# Patient Record
Sex: Female | Born: 1965 | Race: Black or African American | Hispanic: No | Marital: Single | Smoking: Former smoker
Health system: Southern US, Community
[De-identification: ages and names within clinical notes are randomized; demographics above are authoritative.]

## PROBLEM LIST (undated history)

## (undated) DIAGNOSIS — I10 Essential (primary) hypertension: Secondary | ICD-10-CM

## (undated) HISTORY — PX: ACHILLES TENDON REPAIR: SUR1153

## (undated) HISTORY — PX: CHOLECYSTECTOMY: SHX55

## (undated) HISTORY — PX: ANKLE SURGERY: SHX546

## (undated) HISTORY — PX: REPLACEMENT TOTAL KNEE BILATERAL: SUR1225

## (undated) HISTORY — PX: TUBAL LIGATION: SHX77

## (undated) HISTORY — PX: TONSILLECTOMY: SUR1361

---

## 2010-09-17 ENCOUNTER — Emergency Department (HOSPITAL_COMMUNITY)
Admission: EM | Admit: 2010-09-17 | Discharge: 2010-09-17 | Disposition: A | Discharge status: Home / Self Care | Payer: Medicaid Other | Attending: Emergency Medicine | Admitting: Emergency Medicine

## 2010-09-17 DIAGNOSIS — B86 Scabies: Secondary | ICD-10-CM | POA: Insufficient documentation

## 2010-09-17 DIAGNOSIS — R059 Cough, unspecified: Secondary | ICD-10-CM | POA: Insufficient documentation

## 2010-09-17 DIAGNOSIS — J069 Acute upper respiratory infection, unspecified: Secondary | ICD-10-CM | POA: Insufficient documentation

## 2010-09-17 DIAGNOSIS — R05 Cough: Secondary | ICD-10-CM | POA: Insufficient documentation

## 2010-09-22 ENCOUNTER — Emergency Department (HOSPITAL_COMMUNITY): Payer: Medicaid Other

## 2010-09-22 ENCOUNTER — Emergency Department (HOSPITAL_COMMUNITY)
Admission: EM | Admit: 2010-09-22 | Discharge: 2010-09-22 | Disposition: A | Discharge status: Home / Self Care | Payer: Medicaid Other | Attending: Emergency Medicine | Admitting: Emergency Medicine

## 2010-09-22 DIAGNOSIS — J069 Acute upper respiratory infection, unspecified: Secondary | ICD-10-CM | POA: Insufficient documentation

## 2010-09-22 DIAGNOSIS — R0602 Shortness of breath: Secondary | ICD-10-CM | POA: Insufficient documentation

## 2010-09-22 DIAGNOSIS — I10 Essential (primary) hypertension: Secondary | ICD-10-CM | POA: Insufficient documentation

## 2010-10-01 ENCOUNTER — Emergency Department (HOSPITAL_COMMUNITY): Payer: Medicaid Other

## 2010-10-01 ENCOUNTER — Emergency Department (HOSPITAL_COMMUNITY)
Admission: EM | Admit: 2010-10-01 | Discharge: 2010-10-01 | Disposition: A | Discharge status: Home / Self Care | Payer: Medicaid Other | Attending: Emergency Medicine | Admitting: Emergency Medicine

## 2010-10-01 DIAGNOSIS — I10 Essential (primary) hypertension: Secondary | ICD-10-CM | POA: Insufficient documentation

## 2010-10-01 DIAGNOSIS — J45909 Unspecified asthma, uncomplicated: Secondary | ICD-10-CM | POA: Insufficient documentation

## 2010-10-01 DIAGNOSIS — R079 Chest pain, unspecified: Secondary | ICD-10-CM | POA: Insufficient documentation

## 2010-10-01 DIAGNOSIS — J40 Bronchitis, not specified as acute or chronic: Secondary | ICD-10-CM | POA: Insufficient documentation

## 2010-10-01 DIAGNOSIS — R0602 Shortness of breath: Secondary | ICD-10-CM | POA: Insufficient documentation

## 2010-10-12 ENCOUNTER — Emergency Department (HOSPITAL_COMMUNITY): Payer: Medicaid Other

## 2010-10-12 ENCOUNTER — Inpatient Hospital Stay (HOSPITAL_COMMUNITY)
Admission: EM | Admit: 2010-10-12 | Discharge: 2010-10-13 | DRG: 203 | Disposition: A | Discharge status: Home / Self Care | Payer: Medicaid Other | Attending: Internal Medicine | Admitting: Internal Medicine

## 2010-10-12 DIAGNOSIS — E876 Hypokalemia: Secondary | ICD-10-CM | POA: Diagnosis present

## 2010-10-12 DIAGNOSIS — K5900 Constipation, unspecified: Secondary | ICD-10-CM | POA: Diagnosis present

## 2010-10-12 DIAGNOSIS — R071 Chest pain on breathing: Secondary | ICD-10-CM | POA: Diagnosis present

## 2010-10-12 DIAGNOSIS — J45901 Unspecified asthma with (acute) exacerbation: Principal | ICD-10-CM | POA: Diagnosis present

## 2010-10-12 DIAGNOSIS — I498 Other specified cardiac arrhythmias: Secondary | ICD-10-CM | POA: Diagnosis not present

## 2010-10-12 DIAGNOSIS — I10 Essential (primary) hypertension: Secondary | ICD-10-CM | POA: Diagnosis present

## 2010-10-12 DIAGNOSIS — T496X5A Adverse effect of otorhinolaryngological drugs and preparations, initial encounter: Secondary | ICD-10-CM | POA: Diagnosis not present

## 2010-10-12 LAB — DIFFERENTIAL
Basophils Absolute: 0.1 10*3/uL (ref 0.0–0.1)
Basophils Relative: 1 % (ref 0–1)
Eosinophils Absolute: 1.1 10*3/uL — ABNORMAL HIGH (ref 0.0–0.7)
Eosinophils Relative: 8 % — ABNORMAL HIGH (ref 0–5)
Monocytes Absolute: 0.3 10*3/uL (ref 0.1–1.0)
Monocytes Relative: 2 % — ABNORMAL LOW (ref 3–12)
Neutro Abs: 11 10*3/uL — ABNORMAL HIGH (ref 1.7–7.7)

## 2010-10-12 LAB — MRSA PCR SCREENING: MRSA by PCR: NEGATIVE

## 2010-10-12 LAB — CBC
MCH: 27.3 pg (ref 26.0–34.0)
MCHC: 33 g/dL (ref 30.0–36.0)
Platelets: 338 10*3/uL (ref 150–400)
RDW: 16.2 % — ABNORMAL HIGH (ref 11.5–15.5)

## 2010-10-12 LAB — POCT I-STAT, CHEM 8
Chloride: 104 mEq/L (ref 96–112)
Glucose, Bld: 127 mg/dL — ABNORMAL HIGH (ref 70–99)
HCT: 38 % (ref 36.0–46.0)
Potassium: 2.9 mEq/L — ABNORMAL LOW (ref 3.5–5.1)
Sodium: 141 mEq/L (ref 135–145)

## 2010-10-12 LAB — PRO B NATRIURETIC PEPTIDE: Pro B Natriuretic peptide (BNP): 22.3 pg/mL (ref 0–125)

## 2010-10-12 LAB — CK TOTAL AND CKMB (NOT AT ARMC)
CK, MB: 1.3 ng/mL (ref 0.3–4.0)
Relative Index: INVALID (ref 0.0–2.5)
Total CK: 88 U/L (ref 7–177)

## 2010-10-12 LAB — D-DIMER, QUANTITATIVE: D-Dimer, Quant: 0.29 ug/mL-FEU (ref 0.00–0.48)

## 2010-10-13 LAB — DIFFERENTIAL
Basophils Absolute: 0 10*3/uL (ref 0.0–0.1)
Eosinophils Absolute: 0 10*3/uL (ref 0.0–0.7)
Eosinophils Relative: 0 % (ref 0–5)
Monocytes Absolute: 0.1 10*3/uL (ref 0.1–1.0)

## 2010-10-13 LAB — CBC
HCT: 37.1 % (ref 36.0–46.0)
MCHC: 32.1 g/dL (ref 30.0–36.0)
MCV: 82.3 fL (ref 78.0–100.0)
Platelets: 373 10*3/uL (ref 150–400)
RDW: 16.1 % — ABNORMAL HIGH (ref 11.5–15.5)
WBC: 11 10*3/uL — ABNORMAL HIGH (ref 4.0–10.5)

## 2010-10-13 LAB — BASIC METABOLIC PANEL
BUN: 6 mg/dL (ref 6–23)
Calcium: 9.6 mg/dL (ref 8.4–10.5)
GFR calc non Af Amer: >60 mL/min (ref >60)
Glucose, Bld: 189 mg/dL — ABNORMAL HIGH (ref 70–99)
Potassium: 4.2 mEq/L (ref 3.5–5.1)
Sodium: 141 mEq/L (ref 135–145)

## 2010-10-13 LAB — MAGNESIUM: Magnesium: 2.1 mg/dL (ref 1.5–2.5)

## 2010-10-16 NOTE — Discharge Summary (Signed)
Angela Hoffman, [name]             ACCOUNT NO.:  [id number]  MEDICAL RECORD NO.:  [id number]           PATIENT TYPE:  I  LOCATION:  1423                         FACILITY:  [facility]  PHYSICIAN:  Hartley Barefoot, MD    DATE OF BIRTH:  08-Jun-1965  DATE OF ADMISSION:  10/12/2010 DATE OF DISCHARGE:  10/13/2010                              DISCHARGE SUMMARY   DISCHARGE DIAGNOSES: 1. Acute asthma exacerbation. 2. Hypokalemia. 3. Hypertension. 4. Pleuritic chest pain.  DISCHARGE MEDICATIONS: 1. Guaifenesin 110 mg 5 mL by mouth every 4 hours as needed for cough. 2. Levaquin 750 mg p.o. daily. 3. Prednisone taper dose 20 mg 3 tablets for 3 days, 2 tablets for 2     days, then 1 tablet for 2 days, then half tablet, then stop. 4. Albuterol inhaler 90 mcg inhaled every 4 to 6 hours as needed. 5. Polyethylene glycol 17 g p.o. daily as needed. 6. Vicodin 5/500 one tablet every 6 hours as needed. 7. Lasix 40 mg p.o. daily. 8. Lisinopril/hydrochlorothiazide 20/25 p.o. day. 9. Prilosec 20 mg p.o. daily. 10.Xanax 2 mg 1 tablet by mouth daily.  BRIEF HISTORY OF PRESENT ILLNESS:  This is a 45 year old with past medical history of asthma and has had frequent ED visit over the last month, presents with for worsening shortness of breath, cough over the last 3 days.  For further details, please refer to HPI, performed on May 27.  RADIOGRAPHIC STUDIES:  Chest x-ray showed no evidence of acute cardiopulmonary disease.  HOSPITAL COURSE: 1. Asthma exacerbation.  The patient was admitted.  She was started on     IV steroids, Solu-Medrol 60 mg q.6h.  She was also started on     nebulizer treatment every 2 hours and it was then transitioned to     every 4 hours over course of hospitalization.  The patient was also     started on Levaquin and she will finish a total of 5 days.  Next     day of hospitalization, the patient was feeling better.  No     significant wheezing, on lung exam.  She was feeling at  baseline.  She     wants to go home.  She will be discharged on a prednisone     taper dose and antibiotics.  She was advised to follow with her     primary care physician and if any of her symptoms change, to return     to the emergency department. 2. Hypokalemia.  This was repleted.  Her potassium on day of discharge     at 4.2. 3. Hypertension.  The patient will be discharged on     hydrochlorothiazide and lisinopril.  She is also on Lasix for fluid     retention as needed.  I will refer to her primary care physician     but maybe she does not need to be on both hydrochlorothiazide and     Lasix.  On day of discharge, the patient was in a stable condition.  Pulse 96, respirations 16, saturation 96, blood pressure 153/83, temperature 98. Labs, sodium 141, potassium 4.2, chloride 105,  bicarb 25, glucose 189, BUN 6, creatinine 0.6, mag 2.2, white blood cell 11, hemoglobin 11.9, platelets 373,000.  The patient was discharged in improved condition.     Hartley Barefoot, MD     BR/MEDQ  D:  10/13/2010  T:  10/13/2010  Job:  [id number]  Electronically Signed by Hartley Barefoot MD on 10/16/2010 10:04:58 PM

## 2010-10-23 NOTE — H&P (Signed)
Angela Hoffman, Angela Hoffman             ACCOUNT NO.:  [id number]  MEDICAL RECORD NO.:  [id number]           PATIENT TYPE:  I  LOCATION:  1224                         FACILITY:  [facility]  PHYSICIAN:  Tiamarie Furnari L. Ladona Ridgel, MD  DATE OF BIRTH:  Aug 18, 1965  DATE OF ADMISSION:  10/12/2010 DATE OF DISCHARGE:                             HISTORY & PHYSICAL   PRIMARY CARE PHYSICIAN:  The patient does not have a local primary care physician and does not recall her home primary care doctor.  CHIEF COMPLAINT:  Shortness of breath x 3 days.  HISTORY OF PRESENT ILLNESS:  The patient is a 45 year old African American female with a history of asthma.  She states she was seen 2 weeks' ago here and had steroids for similar symptoms of shortness of breath, cough and chest discomfort.  The patient states that she finished her steroids last week and the symptoms started to return.  She describes her symptoms as a tight cough, shortness of breath, yellowsputum, and chest discomfort only when she coughs.  It is located in her central chest and can range from a 5 to a 10 depending on how hard she is coughing.  The patient states that she uses a machine at home which she identifies as a nebulizer, however, what I see at the bedside and documented in previous notes in the ER is that she has metered-dose inhalers.  The patient states her symptoms did not respond to the treatment at home and she does not recall getting antibiotics at the last visit.  However, review of the medical records shows that she did receive doxycycline in the emergency room along with steroids on 05/16. The patient was seen in our emergency room in the [facility] on 5/2, 5/7 and 516.  She returns today for further workup and treatment and after evaluation with an EKG, treatment with IM dexamethasone, albuterol, and Atrovent every couple of hours and then a continuous nebulizer x1, the patient still had a saturation of 80% to 90% on  room air and remained tachypneic.  The patient was also treated with oxycodone for her chest discomfort.  REVIEW OF SYSTEMS:  At this time, the patient states:  EYES:  No blurred vision, double vision.  ENT:  No tinnitus, dysphagia, or discharge. CARDIOVASCULAR:  She does have chest pain but it is pleuritic in nature only on inspiration and cough.  RESPIRATORY:  Positive shortness of breath.  Positive wheezing.  Positive cough.  Positive sputum production.  GI: No nausea, no vomiting or diarrhea.  Instead she does have some constipation.  Last bowel movement was 2 days' ago.  GU:  No hesitancy, frequency or dysuria.  ENDOCRINE:  No polyuria, polydipsia, heat or cold intolerance.  PSYCHIATRIC:  She reports no depression, anxiety.  GYN:  She states that she still has Pap smears even as she had a hysterectomy and she has had a negative breast exam in the past and negative Pap smears.  PAST MEDICAL HISTORY:  Significant for asthma.  She has never been intubated.  She does use the emergency room frequently; her last hospital stay was last year according  to her.  She states that she uses the ER about once a month, although we have documented visits on 5/2, 5/7 and 5/16.  The patient states that she thinks that staying with her son here in [place] may have something to do with it since he lives in the basement apartment.  Other past medical is significant for hypertension.  She denies diabetes.  She does have some anxiety.  PAST SURGICAL HISTORY:  She had a hysterectomy secondary to menorrhagia. She had a tubal ligation.  She has screws in her left ankle, and she has a torn Achilles tendon that needs to be repaired but has not been done so yet.  MEDICATIONS:  She relates that she may have a nebulizer with albuterol and Atrovent, although I do not see that documented in previous notes. She states she takes lisinopril hydrochlorothiazide and she thinks that is 20 mg.  She states she takes  a generic gas pill.  She states she takes Lasix but she does not know the dose; her last dose was yesterday. She also takes MiraLAX 17 g p.o. daily for constipation and she states that she ran out of her Xanax but she usually takes 2 mg b.i.d.  This is an incomplete medication list and she will ask her daughter to please bring her medications.  We will then complete her full medication reconciliation.  ALLERGIES:  No known drug allergies.  SOCIAL HISTORY:  She is staying with her son.  She has two other daughters.  She states they are staying in an apartment that is in the basement which she feels may have something to do with her current illness.  She does not smoke.  She does not drink.  She states she is disabled secondary to her asthma, but she is taking care of her young grandchildren.  FAMILY HISTORY:  Mom is deceased with a history of poor circulation in her legs.  Dad is deceased with a history of ethanol abuse.  PHYSICAL EXAMINATION:  VITAL SIGNS:  Temperature is 97.9, blood pressure 133/90, pulse 106 to 113, respiratory rate 22 to 31, saturations 88% to 98%.  GENERAL:  She is in mild distress, able to complete sentences but does have a slight hitch in her sentences.  She does have audible wheezing. HEENT:  Normocephalic, atraumatic.  Anicteric sclerae.  Pupils are equal, round, reactive to light.  Extraocular muscles are intact. Mucous membranes are moist.  TMs are clear. NECK:  Supple.  No JVD, no lymph nodes, no carotid bruits.  No thyromegaly. CHEST:  Decreased, right greater than left, with audible coarse wheezing, some air entry in the left anterior chest is present otherwise she is very tight. CARDIOVASCULAR:  Tachycardic.  Positive S1, S2.  No S3, S4.  No murmurs, rubs or gallops. ABDOMEN:  Obese, soft, nontender, nondistended with positive bowel sounds.  No hepatosplenomegaly.  No guarding or rebound.  EXTREMITIES: No clubbing, cyanosis or edema. NEUROLOGIC:   Awake, alert, oriented.  Cranial nerves II-XII intact. Power is 5/5.  DTRs 2+, plantars are downgoing.  PERTINENT LABORATORIES:  A D-dimer is 0.29, first CK is 88, MB is 1.3 with a troponin of 0.30.  Her hemoglobin is 12.9, hematocrit 38.  Sodium is 141, potassium 2.9, chloride 104, CO2 25, BUN 5, creatinine 0.70, glucose of 127.  There is no white count or platelet counts available at this time.  X-rays show the lungs are clear without infiltrate.  ASSESSMENT AND PLAN:  This is a 45 year old African American  female with known history of asthma, never intubated, who uses the ER for most of her care.  She was seen in our ER x3 in the last month.  She presents with 3 days of worsening shortness of breath.  She states she just completed a course of steroids last week and the symptoms started to return.  She is coughing up a yellow thick sputum but has had no fevers. She states she was unable to control her symptoms with her inhaler. 1. Asthma exacerbation:  Continue steroids IV q.6, nebulizers q.2 for     the next 24 hours and then consider switching to q.4 h if her     symptoms have improved.  We will start her on Levaquin to cover     atypical infections.  If her symptoms worsen, then may add     magnesium sulfate x1, however, at this time I feel that she may be     turning the corner, but will need close monitoring.  Will use O2, a     flutter valve and Mucinex. 2. Hypokalemia:  She was repleted p.o. in the emergency room.  We will     check an a.m. level and replete again.  Also, will check a     magnesium. 3. Hypertension:  We will obtain her home medication list and order     those medicines as soon as that is available to me. 4. Chest pain with cough:  This appears to pleuritic in nature.  No     further cardiac markers are warranted.  I will, however, check a     BNP since she did state she was having edema and took a Lasix     yesterday.  I do not have an ejection fraction for  her at this     time, nor do I feel that it is warranted at the moment.  The     patient does report intermittent fluid retention. 5. GI prophylaxis while on steroids will be with Protonix. 6. Constipation:  Her last BM was 2 days ago.  Will continue MiraLAX     and add senna as well as Colace.  Senna can be p.r.n. 7. Disposition:  The patient is visiting from [place].  She     states that she is scheduled to return there on Tuesday.  Total time with this patient was from 1:10 p.m. to 2:10 p.m.     Tryone Kille L. Ladona Ridgel, MD     MLT/MEDQ  D:  10/12/2010  T:  10/12/2010  Job:  [id number]  Electronically Signed by Derenda Mis MD on 10/23/2010 12:03:43 PM

## 2011-06-21 ENCOUNTER — Encounter (HOSPITAL_COMMUNITY): Payer: Self-pay

## 2011-06-21 ENCOUNTER — Emergency Department (HOSPITAL_COMMUNITY)
Admission: EM | Admit: 2011-06-21 | Discharge: 2011-06-21 | Disposition: A | Discharge status: Home / Self Care | Payer: Medicaid Other | Attending: Emergency Medicine | Admitting: Emergency Medicine

## 2011-06-21 DIAGNOSIS — R05 Cough: Secondary | ICD-10-CM | POA: Insufficient documentation

## 2011-06-21 DIAGNOSIS — R059 Cough, unspecified: Secondary | ICD-10-CM | POA: Insufficient documentation

## 2011-06-21 DIAGNOSIS — R0602 Shortness of breath: Secondary | ICD-10-CM | POA: Insufficient documentation

## 2011-06-21 DIAGNOSIS — J3489 Other specified disorders of nose and nasal sinuses: Secondary | ICD-10-CM | POA: Insufficient documentation

## 2011-06-21 DIAGNOSIS — R0789 Other chest pain: Secondary | ICD-10-CM | POA: Insufficient documentation

## 2011-06-21 DIAGNOSIS — J45901 Unspecified asthma with (acute) exacerbation: Secondary | ICD-10-CM | POA: Insufficient documentation

## 2011-06-21 DIAGNOSIS — I10 Essential (primary) hypertension: Secondary | ICD-10-CM | POA: Insufficient documentation

## 2011-06-21 HISTORY — DX: Essential (primary) hypertension: I10

## 2011-06-21 MED ORDER — PREDNISONE 50 MG PO TABS: ORAL_TABLET | ORAL | Status: DC

## 2011-06-21 MED ORDER — ALBUTEROL (5 MG/ML) CONTINUOUS INHALATION SOLN
15.0000 mg | INHALATION_SOLUTION | RESPIRATORY_TRACT | Status: DC
  Administered 2011-06-21: 15 mg via RESPIRATORY_TRACT
  Filled 2011-06-21: qty 20

## 2011-06-21 MED ORDER — PREDNISONE 20 MG PO TABS
60.0000 mg | ORAL_TABLET | Freq: Once | ORAL | Status: AC
  Administered 2011-06-21: 60 mg via ORAL
  Filled 2011-06-21: qty 3

## 2011-06-21 MED ORDER — IPRATROPIUM BROMIDE 0.02 % IN SOLN
0.5000 mg | Freq: Once | RESPIRATORY_TRACT | Status: AC
  Administered 2011-06-21: 0.5 mg via RESPIRATORY_TRACT
  Filled 2011-06-21: qty 2.5

## 2011-06-21 NOTE — ED Provider Notes (Signed)
History     CSN: [id number]  Arrival date & time 06/21/11  1441   First MD Initiated Contact with Patient 06/21/11 1605      Chief Complaint  Patient presents with  . Shortness of Breath     Patient is a 46 y.o. female presenting with shortness of breath. The history is provided by the patient.  Shortness of Breath  The current episode started 3 to 5 days ago. The onset was gradual. The problem occurs frequently. The problem has been gradually worsening. The problem is moderate. The symptoms are relieved by beta-agonist inhalers. The symptoms are aggravated by nothing. Associated symptoms include shortness of breath.  pt presents with recent cough/congestion for past several days then noticed worsening wheezing at home Reports mild chest tightness from cough She reports h/o asthma and similar to prior Denies h/o intubations  Past Medical History  Diagnosis Date  . Asthma   . Hypertension     Past Surgical History  Procedure Date  . Cholecystectomy   . Tonsillectomy   . Tubal ligation   . Ankle surgery     History reviewed. No pertinent family history.  History  Substance Use Topics  . Smoking status: Former Games developer  . Smokeless tobacco: Not on file  . Alcohol Use: No    OB History    Grav Para Term Preterm Abortions TAB SAB Ect Mult Living                  Review of Systems  Respiratory: Positive for shortness of breath.   All other systems reviewed and are negative.    Allergies  Review of patient's allergies indicates no known allergies.  Home Medications   Current Outpatient Rx  Name Route Sig Dispense Refill  . ALBUTEROL SULFATE HFA 108 (90 BASE) MCG/ACT IN AERS Inhalation Inhale 2 puffs into the lungs every 6 (six) hours as needed. For wheezing      BP 171/95  Pulse 96  Temp(Src) 98.2 F (36.8 C) (Oral)  Resp 24  SpO2 94%  Physical Exam CONSTITUTIONAL: Well developed/well nourished HEAD AND FACE: Normocephalic/atraumatic EYES:  EOMI/PERRL ENMT: Mucous membranes moist, nsal congestion NECK: supple no meningeal signs SPINE:entire spine nontender CV: S1/S2 noted, no murmurs/rubs/gallops noted LUNGS: tachypnea noted, wheezing noted but pt is able to speak to me clearly ABDOMEN: soft, nontender, no rebound or guarding GU:no cva tenderness NEURO: Pt is awake/alert, moves all extremitiesx4 EXTREMITIES: pulses normal, full ROM, no edema SKIN: warm, color normal PSYCH: no abnormalities of mood noted  ED Course  Procedures   Pt had already received nebs prior to my assessment and she feels improved   Pt improved after treatments, ambulating, no hypoxia, no dyspnea on exertion.  Well appearing, stable for d/c Discussed strict return precautions   MDM  Nursing notes reviewed and considered in documentation         Joya Gaskins, MD 06/21/11 [phone]

## 2011-06-21 NOTE — ED Notes (Signed)
Pt with asthma, nebs and inhalers at home not working, coughing yellow phlegm, active audible wheezing in triage

## 2011-06-21 NOTE — ED Notes (Signed)
Patient O2 stats were between 98% and 100% while walking.

## 2011-06-21 NOTE — ED Notes (Signed)
RT back in with patient

## 2011-07-01 ENCOUNTER — Encounter (HOSPITAL_COMMUNITY): Payer: Self-pay

## 2011-07-01 ENCOUNTER — Emergency Department (HOSPITAL_COMMUNITY): Payer: Medicaid Other

## 2011-07-01 ENCOUNTER — Other Ambulatory Visit: Payer: Self-pay

## 2011-07-01 ENCOUNTER — Emergency Department (HOSPITAL_COMMUNITY)
Admission: EM | Admit: 2011-07-01 | Discharge: 2011-07-02 | Disposition: A | Discharge status: Home / Self Care | Payer: Medicaid Other | Attending: Emergency Medicine | Admitting: Emergency Medicine

## 2011-07-01 DIAGNOSIS — R0789 Other chest pain: Secondary | ICD-10-CM

## 2011-07-01 DIAGNOSIS — R059 Cough, unspecified: Secondary | ICD-10-CM | POA: Insufficient documentation

## 2011-07-01 DIAGNOSIS — J45901 Unspecified asthma with (acute) exacerbation: Secondary | ICD-10-CM | POA: Insufficient documentation

## 2011-07-01 DIAGNOSIS — R05 Cough: Secondary | ICD-10-CM | POA: Insufficient documentation

## 2011-07-01 DIAGNOSIS — R079 Chest pain, unspecified: Secondary | ICD-10-CM | POA: Insufficient documentation

## 2011-07-01 DIAGNOSIS — I10 Essential (primary) hypertension: Secondary | ICD-10-CM | POA: Insufficient documentation

## 2011-07-01 MED ORDER — ALBUTEROL SULFATE (5 MG/ML) 0.5% IN NEBU
5.0000 mg | INHALATION_SOLUTION | Freq: Once | RESPIRATORY_TRACT | Status: AC
  Administered 2011-07-01: 5 mg via RESPIRATORY_TRACT
  Filled 2011-07-01: qty 1

## 2011-07-01 MED ORDER — PREDNISONE 20 MG PO TABS: 40.0000 mg | ORAL_TABLET | Freq: Every day | ORAL | Status: AC

## 2011-07-01 MED ORDER — SODIUM CHLORIDE 0.9 % IV SOLN
INTRAVENOUS | Status: DC
  Administered 2011-07-01: 22:00:00 via INTRAVENOUS

## 2011-07-01 MED ORDER — IBUPROFEN 800 MG PO TABS
800.0000 mg | ORAL_TABLET | Freq: Once | ORAL | Status: AC
  Administered 2011-07-01: 800 mg via ORAL
  Filled 2011-07-01: qty 1

## 2011-07-01 MED ORDER — ALBUTEROL (5 MG/ML) CONTINUOUS INHALATION SOLN
INHALATION_SOLUTION | RESPIRATORY_TRACT | Status: AC
  Filled 2011-07-01: qty 20

## 2011-07-01 MED ORDER — HYDROCOD POLST-CHLORPHEN POLST 10-8 MG/5ML PO LQCR
5.0000 mL | Freq: Once | ORAL | Status: AC
  Administered 2011-07-01: 5 mL via ORAL
  Filled 2011-07-01: qty 5

## 2011-07-01 MED ORDER — HYDROCODONE-ACETAMINOPHEN 5-325 MG PO TABS
1.0000 | ORAL_TABLET | Freq: Once | ORAL | Status: AC
  Administered 2011-07-01: 1 via ORAL
  Filled 2011-07-01: qty 1

## 2011-07-01 MED ORDER — HYDROCOD POLST-CHLORPHEN POLST 10-8 MG/5ML PO LQCR: 5.0000 mL | Freq: Two times a day (BID) | ORAL | Status: DC | PRN

## 2011-07-01 MED ORDER — IBUPROFEN 400 MG PO TABS: 400.0000 mg | ORAL_TABLET | Freq: Four times a day (QID) | ORAL | Status: AC | PRN

## 2011-07-01 MED ORDER — ALBUTEROL SULFATE (5 MG/ML) 0.5% IN NEBU
15.0000 mg | INHALATION_SOLUTION | Freq: Once | RESPIRATORY_TRACT | Status: DC
  Filled 2011-07-01: qty 3

## 2011-07-01 MED ORDER — METHYLPREDNISOLONE SODIUM SUCC 125 MG IJ SOLR
125.0000 mg | Freq: Once | INTRAMUSCULAR | Status: AC
  Administered 2011-07-01: 125 mg via INTRAVENOUS
  Filled 2011-07-01: qty 2

## 2011-07-01 NOTE — ED Notes (Signed)
Pt st's she has had diff. Breathing for several days  Has been using her nebs at home without any relief.  Pt was seen here on Sat, st's she is not any better.

## 2011-07-01 NOTE — ED Notes (Signed)
Patient presents with inspiratory and expiratory wheezing, shortness of breath and chest pain since Sunday. Patient seen on Sunday and was given breathing treatment and steroids and patient reporting she's not improving.  Patient reporting chest discomfort and rib pain from coughing.

## 2011-07-01 NOTE — ED Notes (Signed)
amb to bathroom without difficulty 

## 2011-07-01 NOTE — ED Provider Notes (Signed)
History     CSN: [id number]  Arrival date & time 07/01/11  4098   First MD Initiated Contact with Patient 07/01/11 2015      Chief Complaint  Patient presents with  . Wheezing  . Chest Pain    HPI: Patient is a 46 y.o. female presenting with wheezing and chest pain. The history is provided by the patient.  Wheezing  The current episode started 3 to 5 days ago. The problem occurs frequently. The problem has been gradually worsening. The problem is moderate. The symptoms are relieved by beta-agonist inhalers. The symptoms are aggravated by activity. Associated symptoms include chest pain, cough and wheezing. Pertinent negatives include no fever. Her past medical history is significant for asthma. Recently, medical care has been given at this facility. Services received include medications given.  Chest Pain Primary symptoms include cough and wheezing. Pertinent negatives for primary symptoms include no fever.  The patient's medical history is significant for asthma.   Patient reports increased shortness of breath and wheezing over the last 2-3 days. States was seen here in the emergency department approximately 2 weeks and ago was given breathing treatments, and put on prednisone for 2-3 days. Symptoms improved slightly but then started again several days ago. She receives minimal, brief relief with her inhaler. Reports frequent cough to the point where she is having chest wall pain every time she coughs or takes a deep breath. Denies fever.  Past Medical History  Diagnosis Date  . Asthma   . Hypertension     Past Surgical History  Procedure Date  . Cholecystectomy   . Tonsillectomy   . Tubal ligation   . Ankle surgery   . Tubal ligation   . Ankle surgery     History reviewed. No pertinent family history.  History  Substance Use Topics  . Smoking status: Former Games developer  . Smokeless tobacco: Not on file  . Alcohol Use: No    OB History    Grav Para Term Preterm Abortions  TAB SAB Ect Mult Living                  Review of Systems  Constitutional: Negative for fever.  Respiratory: Positive for cough and wheezing.   Cardiovascular: Positive for chest pain.    Allergies  Review of patient's allergies indicates no known allergies.  Home Medications   Current Outpatient Rx  Name Route Sig Dispense Refill  . ALBUTEROL SULFATE HFA 108 (90 BASE) MCG/ACT IN AERS Inhalation Inhale 2 puffs into the lungs every 6 (six) hours as needed. For wheezing    . PREDNISONE 50 MG PO TABS Oral Take 50 mg by mouth daily. For 4 days; Start date 06/27/11      BP 166/100  Pulse 93  Temp(Src) 98.7 F (37.1 C) (Oral)  Resp 25  SpO2 97%  Physical Exam  Constitutional: She is oriented to person, place, and time. She appears well-developed and well-nourished.  HENT:  Head: Normocephalic and atraumatic.  Eyes: Conjunctivae are normal.  Neck: Neck supple.  Cardiovascular: Normal rate and regular rhythm.   Pulmonary/Chest: Tachypnea noted.       Mildly tachypneac with decreased air movement bil and insp/exps wheezes through-out after 1 st neb.   Abdominal: Soft. Bowel sounds are normal.  Musculoskeletal: Normal range of motion.  Neurological: She is alert and oriented to person, place, and time.  Skin: Skin is warm and dry. Rash noted. Rash is papular. No erythema.  Psychiatric: She has a  normal mood and affect.    ED Course  Procedures    Date: 07/02/2011  Rate:   Rhythm: normal sinus rhythm  QRS Axis: normal  Intervals: normal  ST/T Wave abnormalities: normal  Conduction Disutrbances:none  Narrative Interpretation:   Old EKG Reviewed: unchanged  Pt w/ persistent wheezes through-out after 1st neb. Will give IV solu-medrol and start albuterol neb (15mg ) over 1 hour. Will obtain CXR and re-evaluate.  Pt now with BBS CTA and reports breathing much easier since 1 hour neb. Will plan for d/c home on Prednisone x 1 week, medication for persistent cough and encourage  close f/u w/ her PCP upon her return home tomorrow. Pt agreeable w/ plan.  Labs Reviewed - No data to display Dg Chest 2 View  07/01/2011  *RADIOLOGY REPORT*  Clinical Data: Cough and wheezing for 1 week.  CHEST - 2 VIEW  Comparison: 10/12/2010  Findings: Normal heart size and pulmonary vascularity.  The lungs appear clear and expanded.  No focal airspace consolidation.  No pneumothorax.  No blunting of costophrenic angles.  Degenerative changes in the thoracic spine.  Surgical clips in the upper abdomen.  No significant change since the previous study.  IMPRESSION: No evidence of active pulmonary disease.  Original Report Authenticated By: Marlon Pel, M.D.     No diagnosis found.    MDM  HPI/PE and clinical findings/course c/w 1. Asthma attack/Flare (BBS CTA and nebs, pt reports breathing much improved at d/c, CXR w/o acute findings) 2. Musculoskeletal chest wall pain (Likely a result of persistent coughing)        Leanne Chang, NP 07/02/11 0104  Roma Kayser Aileen Amore, NP 07/02/11 0105

## 2011-07-01 NOTE — Discharge Instructions (Signed)
Please read over the instructions below. He has been treated for your asthma this evening and admits feeling much better after several breathing treatments and a dose of IV steroids. We are sending him home on the prednisone to take daily for 5 days, be sure to complete. Take the ibuprofen 400 mg 3 times a day for the next 3 days for your chest wall pain. Take the cough medicine as directed as needed for cough. It is very important that you arrange followup with your primary care physician upon your return home to stay until tomorrow. This is your second treatment for asthma attack in the last 2-3 weeks. It is very important that you arrange follow up with your primary care physician as he may want to consider other medications to help manage your asthma symptoms. Return for worsening symptoms.  Asthma Attack Prevention  HOW CAN ASTHMA BE PREVENTED? Currently, there is no way to prevent asthma from starting. However, you can take steps to control the disease and prevent its symptoms after you have been diagnosed. Learn about your asthma and how to control it. Take an active role to control your asthma by working with your caregiver to create and follow an asthma action plan. An asthma action plan guides you in taking your medicines properly, avoiding factors that make your asthma worse, tracking your level of asthma control, responding to worsening asthma, and seeking emergency care when needed. To track your asthma, keep records of your symptoms, check your peak flow number using a peak flow meter (handheld device that shows how well air moves out of your lungs), and get regular asthma checkups.  Other ways to prevent asthma attacks include:  Use medicines as your caregiver directs.   Identify and avoid things that make your asthma worse (as much as you can).   Keep track of your asthma symptoms and level of control.   Get regular checkups for your asthma.   With your caregiver, write a detailed  plan for taking medicines and managing an asthma attack. Then be sure to follow your action plan. Asthma is an ongoing condition that needs regular monitoring and treatment.   Identify and avoid asthma triggers. A number of outdoor allergens and irritants (pollen, mold, cold air, air pollution) can trigger asthma attacks. Find out what causes or makes your asthma worse, and take steps to avoid those triggers (see below).   Monitor your breathing. Learn to recognize warning signs of an attack, such as slight coughing, wheezing or shortness of breath. However, your lung function may already decrease before you notice any signs or symptoms, so regularly measure and record your peak airflow with a home peak flow meter.   Identify and treat attacks early. If you act quickly, you're less likely to have a severe attack. You will also need less medicine to control your symptoms. When your peak flow measurements decrease and alert you to an upcoming attack, take your medicine as instructed, and immediately stop any activity that may have triggered the attack. If your symptoms do not improve, get medical help.   Pay attention to increasing quick-relief inhaler use. If you find yourself relying on your quick-relief inhaler (such as albuterol), your asthma is not under control. See your caregiver about adjusting your treatment.  IDENTIFY AND CONTROL FACTORS THAT MAKE YOUR ASTHMA WORSE A number of common things can set off or make your asthma symptoms worse (asthma triggers). Keep track of your asthma symptoms for several weeks, detailing all the  environmental and emotional factors that are linked with your asthma. When you have an asthma attack, go back to your asthma diary to see which factor, or combination of factors, might have contributed to it. Once you know what these factors are, you can take steps to control many of them.  Allergies: If you have allergies and asthma, it is important to take asthma prevention  steps at home. Asthma attacks (worsening of asthma symptoms) can be triggered by allergies, which can cause temporary increased inflammation of your airways. Minimizing contact with the substance to which you are allergic will help prevent an asthma attack. Animal Dander:   Some people are allergic to the flakes of skin or dried saliva from animals with fur or feathers. Keep these pets out of your home.   If you can't keep a pet outdoors, keep the pet out of your bedroom and other sleeping areas at all times, and keep the door closed.   Remove carpets and furniture covered with cloth from your home. If that is not possible, keep the pet away from fabric-covered furniture and carpets.  Dust Mites:  Many people with asthma are allergic to dust mites. Dust mites are tiny bugs that are found in every home, in mattresses, pillows, carpets, fabric-covered furniture, bedcovers, clothes, stuffed toys, fabric, and other fabric-covered items.   Cover your mattress in a special dust-proof cover.   Cover your pillow in a special dust-proof cover, or wash the pillow each week in hot water. Water must be hotter than 130 F to kill dust mites. Cold or warm water used with detergent and bleach can also be effective.   Wash the sheets and blankets on your bed each week in hot water.   Try not to sleep or lie on cloth-covered cushions.   Call ahead when traveling and ask for a smoke-free hotel room. Bring your own bedding and pillows, in case the hotel only supplies feather pillows and down comforters, which may contain dust mites and cause asthma symptoms.   Remove carpets from your bedroom and those laid on concrete, if you can.   Keep stuffed toys out of the bed, or wash the toys weekly in hot water or cooler water with detergent and bleach.  Cockroaches:  Many people with asthma are allergic to the droppings and remains of cockroaches.   Keep food and garbage in closed containers. Never leave food  out.   Use poison baits, traps, powders, gels, or paste (for example, boric acid).   If a spray is used to kill cockroaches, stay out of the room until the odor goes away.  Indoor Mold:  Fix leaky faucets, pipes, or other sources of water that have mold around them.   Clean moldy surfaces with a cleaner that has bleach in it.  Pollen and Outdoor Mold:  When pollen or mold spore counts are high, try to keep your windows closed.   Stay indoors with windows closed from late morning to afternoon, if you can. Pollen and some mold spore counts are highest at that time.   Ask your caregiver whether you need to take or increase anti-inflammatory medicine before your allergy season starts.  Irritants:   Tobacco smoke is an irritant. If you smoke, ask your caregiver how you can quit. Ask family members to quit smoking, too. Do not allow smoking in your home or car.   If possible, do not use a wood-burning stove, kerosene heater, or fireplace. Minimize exposure to all sources of  smoke, including incense, candles, fires, and fireworks.   Try to stay away from strong odors and sprays, such as perfume, talcum powder, hair spray, and paints.   Decrease humidity in your home and use an indoor air cleaning device. Reduce indoor humidity to below 60 percent. Dehumidifiers or central air conditioners can do this.   Try to have someone else vacuum for you once or twice a week, if you can. Stay out of rooms while they are being vacuumed and for a short while afterward.   If you vacuum, use a dust mask from a hardware store, a double-layered or microfilter vacuum cleaner bag, or a vacuum cleaner with a HEPA filter.   Sulfites in foods and beverages can be irritants. Do not drink beer or wine, or eat dried fruit, processed potatoes, or shrimp if they cause asthma symptoms.   Cold air can trigger an asthma attack. Cover your nose and mouth with a scarf on cold or windy days.   Several health conditions  can make asthma more difficult to manage, including runny nose, sinus infections, reflux disease, psychological stress, and sleep apnea. Your caregiver will treat these conditions, as well.   Avoid close contact with people who have a cold or the flu, since your asthma symptoms may get worse if you catch the infection from them. Wash your hands thoroughly after touching items that may have been handled by people with a respiratory infection.   Get a flu shot every year to protect against the flu virus, which often makes asthma worse for days or weeks. Also get a pneumonia shot once every five to 10 years.  Drugs:  Aspirin and other painkillers can cause asthma attacks. 10% to 20% of people with asthma have sensitivity to aspirin or a group of painkillers called non-steroidal anti-inflammatory drugs (NSAIDS), such as ibuprofen and naproxen. These drugs are used to treat pain and reduce fevers. Asthma attacks caused by any of these medicines can be severe and even fatal. These drugs must be avoided in people who have known aspirin sensitive asthma. Products with acetaminophen are considered safe for people who have asthma. It is important that people with aspirin sensitivity read labels of all over-the-counter drugs used to treat pain, colds, coughs, and fever.   Beta blockers and ACE inhibitors are other drugs which you should discuss with your caregiver, in relation to your asthma.  ALLERGY SKIN TESTING  Ask your asthma caregiver about allergy skin testing or blood testing (RAST test) to identify the allergens to which you are sensitive. If you are found to have allergies, allergy shots (immunotherapy) for asthma may help prevent future allergies and asthma. With allergy shots, small doses of allergens (substances to which you are allergic) are injected under your skin on a regular schedule. Over a period of time, your body may become used to the allergen and less responsive with asthma symptoms. You can  also take measures to minimize your exposure to those allergens. EXERCISE  If you have exercise-induced asthma, or are planning vigorous exercise, or exercise in cold, humid, or dry environments, prevent exercise-induced asthma by following your caregiver's advice regarding asthma treatment before exercising. Document Released: 04/22/2009 Document Revised: 01/14/2011 Document Reviewed: 04/22/2009 [facility] Patient Information 2012 [place], [place].

## 2011-07-01 NOTE — ED Notes (Signed)
Respiratory at bedside.

## 2011-07-02 NOTE — ED Notes (Signed)
Pt. Discharged to home with family.

## 2011-07-03 NOTE — ED Provider Notes (Signed)
Medical screening examination/treatment/procedure(s) were performed by non-physician practitioner and as supervising physician I was immediately available for consultation/collaboration.  Doug Sou, MD 07/03/11 1346

## 2013-07-29 ENCOUNTER — Emergency Department (HOSPITAL_COMMUNITY)
Admission: EM | Admit: 2013-07-29 | Discharge: 2013-07-29 | Disposition: A | Discharge status: Home / Self Care | Payer: Medicaid Other | Attending: Emergency Medicine | Admitting: Emergency Medicine

## 2013-07-29 ENCOUNTER — Emergency Department (HOSPITAL_COMMUNITY): Payer: Medicaid Other

## 2013-07-29 ENCOUNTER — Encounter (HOSPITAL_COMMUNITY): Payer: Self-pay | Admitting: Emergency Medicine

## 2013-07-29 DIAGNOSIS — Z87891 Personal history of nicotine dependence: Secondary | ICD-10-CM | POA: Insufficient documentation

## 2013-07-29 DIAGNOSIS — J45901 Unspecified asthma with (acute) exacerbation: Secondary | ICD-10-CM | POA: Insufficient documentation

## 2013-07-29 DIAGNOSIS — J45909 Unspecified asthma, uncomplicated: Secondary | ICD-10-CM

## 2013-07-29 DIAGNOSIS — I10 Essential (primary) hypertension: Secondary | ICD-10-CM | POA: Insufficient documentation

## 2013-07-29 DIAGNOSIS — R0981 Nasal congestion: Secondary | ICD-10-CM

## 2013-07-29 DIAGNOSIS — Z9089 Acquired absence of other organs: Secondary | ICD-10-CM | POA: Insufficient documentation

## 2013-07-29 DIAGNOSIS — J40 Bronchitis, not specified as acute or chronic: Secondary | ICD-10-CM

## 2013-07-29 MED ORDER — HYDROCODONE-HOMATROPINE 5-1.5 MG/5ML PO SYRP: 5.0000 mL | ORAL_SOLUTION | Freq: Four times a day (QID) | ORAL | Status: DC | PRN

## 2013-07-29 MED ORDER — ALBUTEROL SULFATE HFA 108 (90 BASE) MCG/ACT IN AERS: 2.0000 | INHALATION_SPRAY | RESPIRATORY_TRACT | Status: DC | PRN

## 2013-07-29 MED ORDER — ALBUTEROL SULFATE (2.5 MG/3ML) 0.083% IN NEBU
5.0000 mg | INHALATION_SOLUTION | Freq: Once | RESPIRATORY_TRACT | Status: AC
  Administered 2013-07-29: 5 mg via RESPIRATORY_TRACT
  Filled 2013-07-29: qty 6

## 2013-07-29 MED ORDER — IPRATROPIUM BROMIDE 0.02 % IN SOLN
0.5000 mg | Freq: Once | RESPIRATORY_TRACT | Status: AC
  Administered 2013-07-29: 0.5 mg via RESPIRATORY_TRACT
  Filled 2013-07-29: qty 2.5

## 2013-07-29 MED ORDER — OXYMETAZOLINE HCL 0.05 % NA SOLN: 1.0000 | Freq: Two times a day (BID) | NASAL | Status: DC

## 2013-07-29 MED ORDER — DEXAMETHASONE SODIUM PHOSPHATE 10 MG/ML IJ SOLN
10.0000 mg | Freq: Once | INTRAMUSCULAR | Status: AC
  Administered 2013-07-29: 10 mg via INTRAMUSCULAR
  Filled 2013-07-29: qty 1

## 2013-07-29 NOTE — ED Provider Notes (Signed)
Medical screening examination/treatment/procedure(s) were performed by non-physician practitioner and as supervising physician I was immediately available for consultation/collaboration.   EKG Interpretation None        [name], MD 07/29/13 2321 

## 2013-07-29 NOTE — Discharge Instructions (Signed)
Bronchitis Bronchitis is inflammation of the airways that extend from the windpipe into the lungs (bronchi). The inflammation often causes mucus to develop, which leads to a cough. If the inflammation becomes severe, it may cause shortness of breath. CAUSES  Bronchitis may be caused by:   Viral infections.   Bacteria.   Cigarette smoke.   Allergens, pollutants, and other irritants.  SIGNS AND SYMPTOMS  The most common symptom of bronchitis is a frequent cough that produces mucus. Other symptoms include:  Fever.   Body aches.   Chest congestion.   Chills.   Shortness of breath.   Sore throat.  DIAGNOSIS  Bronchitis is usually diagnosed through a medical history and physical exam. Tests, such as chest X-rays, are sometimes done to rule out other conditions.  TREATMENT  You may need to avoid contact with whatever caused the problem (smoking, for example). Medicines are sometimes needed. These may include:  Antibiotics. These may be prescribed if the condition is caused by bacteria.  Cough suppressants. These may be prescribed for relief of cough symptoms.   Inhaled medicines. These may be prescribed to help open your airways and make it easier for you to breathe.   Steroid medicines. These may be prescribed for those with recurrent (chronic) bronchitis. HOME CARE INSTRUCTIONS  Get plenty of rest.   Drink enough fluids to keep your urine clear or pale yellow (unless you have a medical condition that requires fluid restriction). Increasing fluids may help thin your secretions and will prevent dehydration.   Only take over-the-counter or prescription medicines as directed by your health care provider.  Only take antibiotics as directed. Make sure you finish them even if you start to feel better.  Avoid secondhand smoke, irritating chemicals, and strong fumes. These will make bronchitis worse. If you are a smoker, quit smoking. Consider using nicotine gum or  skin patches to help control withdrawal symptoms. Quitting smoking will help your lungs heal faster.   Put a cool-mist humidifier in your bedroom at night to moisten the air. This may help loosen mucus. Change the water in the humidifier daily. You can also run the hot water in your shower and sit in the bathroom with the door closed for 5 10 minutes.   Follow up with your health care provider as directed.   Wash your hands frequently to avoid catching bronchitis again or spreading an infection to others.  SEEK MEDICAL CARE IF: Your symptoms do not improve after 1 week of treatment.  SEEK IMMEDIATE MEDICAL CARE IF:  Your fever increases.  You have chills.   You have chest pain.   You have worsening shortness of breath.   You have bloody sputum.  You faint.  You have lightheadedness.  You have a severe headache.   You vomit repeatedly. MAKE SURE YOU:   Understand these instructions.  Will watch your condition.  Will get help right away if you are not doing well or get worse. Document Released: 05/04/2005 Document Revised: 02/22/2013 Document Reviewed: 12/27/2012 [facility] Patient Information 2014 [place].  Asthma, Adult Asthma is a recurring condition in which the airways tighten and narrow. Asthma can make it difficult to breathe. It can cause coughing, wheezing, and shortness of breath. Asthma episodes (also called asthma attacks) range from minor to life-threatening. Asthma cannot be cured, but medicines and lifestyle changes can help control it. CAUSES Asthma is believed to be caused by inherited (genetic) and environmental factors, but its exact cause is unknown. Asthma may be  triggered by allergens, lung infections, or irritants in the air. Asthma triggers are different for each person. Common triggers include:  °· Animal dander. °· Dust mites. °· Cockroaches. °· Pollen from trees or grass. °· Mold. °· Smoke. °· Air pollutants such as dust, household  cleaners, hair sprays, aerosol sprays, paint fumes, strong chemicals, or strong odors. °· Cold air, weather changes, and winds (which increase molds and pollens in the air). °· Strong emotional expressions such as crying or laughing hard. °· Stress. °· Certain medicines (such as aspirin) or types of drugs (such as beta-blockers). °· Sulfites in foods and drinks. Foods and drinks that may contain sulfites include dried fruit, potato chips, and sparkling grape juice. °· Infections or inflammatory conditions such as the flu, a cold, or an inflammation of the nasal membranes (rhinitis). °· Gastroesophageal reflux disease (GERD). °· Exercise or strenuous activity. °SYMPTOMS °Symptoms may occur immediately after asthma is triggered or many hours later. Symptoms include: °· Wheezing. °· Excessive nighttime or early morning coughing. °· Frequent or severe coughing with a common cold. °· Chest tightness. °· Shortness of breath. °DIAGNOSIS  °The diagnosis of asthma is made by a review of your medical history and a physical exam. Tests may also be performed. These may include: °· Lung function studies. These tests show how much air you breath in and out. °· Allergy tests. °· Imaging tests such as X-rays. °TREATMENT  °Asthma cannot be cured, but it can usually be controlled. Treatment involves identifying and avoiding your asthma triggers. It also involves medicines. There are 2 classes of medicine used for asthma treatment:  °· Controller medicines. These prevent asthma symptoms from occurring. They are usually taken every day. °· Reliever or rescue medicines. These quickly relieve asthma symptoms. They are used as needed and provide short-term relief. °Your health care provider will help you create an asthma action plan. An asthma action plan is a written plan for managing and treating your asthma attacks. It includes a list of your asthma triggers and how they may be avoided. It also includes information on when medicines  should be taken and when their dosage should be changed. An action plan may also involve the use of a device called a peak flow meter. A peak flow meter measures how well the lungs are working. It helps you monitor your condition. °HOME CARE INSTRUCTIONS  °· Take medicine as directed by your health care provider. Speak with your health care provider if you have questions about how or when to take the medicines. °· Use a peak flow meter as directed by your health care provider. Record and keep track of readings. °· Understand and use the action plan to help minimize or stop an asthma attack without needing to seek medical care. °· Control your home environment in the following ways to help prevent asthma attacks: °· Do not smoke. Avoid being exposed to secondhand smoke. °· Change your heating and air conditioning filter regularly. °· Limit your use of fireplaces and wood stoves. °· Get rid of pests (such as roaches and mice) and their droppings. °· Throw away plants if you see mold on them. °· Clean your floors and dust regularly. Use unscented cleaning products. °· Try to have someone else vacuum for you regularly. Stay out of rooms while they are being vacuumed and for a short while afterward. If you vacuum, use a dust mask from a hardware store, a double-layered or microfilter vacuum cleaner bag, or a vacuum cleaner with a HEPA   filter.  Replace carpet with wood, tile, or vinyl flooring. Carpet can trap dander and dust.  Use allergy-proof pillows, mattress covers, and box spring covers.  Wash bed sheets and blankets every week in hot water and dry them in a dryer.  Use blankets that are made of polyester or cotton.  Clean bathrooms and kitchens with bleach. If possible, have someone repaint the walls in these rooms with mold-resistant paint. Keep out of the rooms that are being cleaned and painted.  Wash hands frequently. SEEK MEDICAL CARE IF:   You have wheezing, shortness of breath, or a cough even  if taking medicine to prevent attacks.  The colored mucus you cough up (sputum) is thicker than usual.  Your sputum changes from clear or white to yellow, green, gray, or bloody.  You have any problems that may be related to the medicines you are taking (such as a rash, itching, swelling, or trouble breathing).  You are using a reliever medicine more than 2 3 times per week.  Your peak flow is still at 50 79% of you personal best after following your action plan for 1 hour. SEEK IMMEDIATE MEDICAL CARE IF:   You seem to be getting worse and are unresponsive to treatment during an asthma attack.  You are short of breath even at rest.  You get short of breath when doing very little physical activity.  You have difficulty eating, drinking, or talking due to asthma symptoms.  You develop chest pain.  You develop a fast heartbeat.  You have a bluish color to your lips or fingernails.  You are lightheaded, dizzy, or faint.  Your peak flow is less than 50% of your personal best.  You have a fever or persistent symptoms for more than 2 3 days.  You have a fever and symptoms suddenly get worse. MAKE SURE YOU:   Understand these instructions.  Will watch your condition.  Will get help right away if you are not doing well or get worse. Document Released: 05/04/2005 Document Revised: 01/04/2013 Document Reviewed: 12/01/2012 [facility] Patient Information 2014 [place], [place]. Homatropine; Hydrocodone oral syrup What is this medicine? HYDROCODONE (hye droe KOE done) is used to help relieve cough. This medicine may be used for other purposes; ask your health care provider or pharmacist if you have questions. COMMON BRAND NAME(S): Hycodan, Hydromet, Hydropane, Mycodone What should I tell my health care provider before I take this medicine? They need to know if you have any of these conditions: -brain tumor -drug abuse or addiction -head injury -heart disease -if you frequently  drink alcohol-containing drinks -kidney disease -liver disease -lung disease, asthma, or breathing problems -mental problems -an allergic reaction to hydrocodone, other opioid analgesics, other medicines, foods, dyes, or preservatives -pregnant or trying to get pregnant -breast-feeding How should I use this medicine? Take this medicine by mouth with a full glass of water. Use a specially marked spoon or dropper to measure your dose. Ask your pharmacist if you do not have one. Do not use a household spoon. Follow the directions on the prescription label. Take your medicine at regular intervals. Do not take your medicine more often than directed. Talk to your pediatrician regarding the use of this medicine in children. This medicine is not approved for use in children less than 77 years old. Patients over 84 years old may have a stronger reaction and need a smaller dose. Overdosage: If you think you have taken too much of this medicine contact a poison control  center or emergency room at once. NOTE: This medicine is only for you. Do not share this medicine with others. What if I miss a dose? If you miss a dose, take it as soon as you can. If it is almost time for your next dose, take only that dose. Do not take double or extra doses. What may interact with this medicine? -alcohol -antihistamines -MAOIs like Carbex, Eldepryl, Marplan, Nardil, and Parnate -medicines for depression, anxiety, or psychotic disturbances -medicines for sleep -muscle relaxants -naltrexone -narcotic medicines (opiates) for pain -tramadol -tricyclic antidepressants like amitriptyline, clomipramine, desipramine, doxepin, imipramine, nortriptyline, and protriptyline This list may not describe all possible interactions. Give your health care provider a list of all the medicines, herbs, non-prescription drugs, or dietary supplements you use. Also tell them if you smoke, drink alcohol, or use illegal drugs. Some items may  interact with your medicine. What should I watch for while using this medicine? You may develop tolerance to this medicine if you take it for a long time. Tolerance means that you will get less cough relief with time. Tell your doctor or health care professional if you symptoms do not improve or if they get worse. Do not suddenly stop taking your medicine because you may develop a severe reaction. Your body becomes used to the medicine. This does NOT mean you are addicted. Addiction is a behavior related to getting and using a drug for a non-medical reason. If your doctor wants you to stop the medicine, the dose will be slowly lowered over time to avoid any side effects. You may get drowsy or dizzy. Do not drive, use machinery, or do anything that needs mental alertness until you know how this medicine affects you. Do not stand or sit up quickly, especially if you are an older patient. This reduces the risk of dizzy or fainting spells. Alcohol may interfere with the effect of this medicine. Avoid alcoholic drinks. This medicine will cause constipation. Try to have a bowel movement at least every 2 to 3 days. If you do not have a bowel movement for 3 days, call your doctor or health care professional. What side effects may I notice from receiving this medicine? Side effects that you should report to your doctor or health care professional as soon as possible: -allergic reactions like skin rash, itching or hives, swelling of the face, lips, or tongue -breathing difficulties, wheezing -confusion -light headedness or fainting spells Side effects that usually do not require medical attention (report to your doctor or health care professional if they continue or are bothersome): -dizziness -drowsiness -itching -nausea -vomiting This list may not describe all possible side effects. Call your doctor for medical advice about side effects. You may report side effects to FDA at 1-800-FDA-1088. Where should I  keep my medicine? Keep out of the reach of children. This medicine can be abused. Keep your medicine in a safe place to protect it from theft. Do not share this medicine with anyone. Selling or giving away this medicine is dangerous and against the law. Store at room temperature between 15 and 30 degrees C (59 and 86 degrees F). Protect from light. Throw away any unused medicine after the expiration date. Discard unused medicine and used packaging carefully. Pets and children can be harmed if they find used or lost packages. NOTE: This sheet is a summary. It may not cover all possible information. If you have questions about this medicine, talk to your doctor, pharmacist, or health care provider.  2014,  [employer]. (2011-01-12 10:04:07)

## 2013-07-29 NOTE — ED Notes (Signed)
Respiratory at bedside.

## 2013-07-29 NOTE — ED Notes (Signed)
Respiratory called

## 2013-07-29 NOTE — ED Notes (Signed)
She c/o cough productive of sm. Amts. Of yellow phlegm x 1 week.  She is in no distress and fine ex. Wheezes are audible without stethoscope.

## 2013-07-29 NOTE — ED Provider Notes (Signed)
CSN: [id number]     Arrival date & time 07/29/13  1552 History   None    This chart was scribed for non-physician practitioner, Arthor CaptainAbigail Kashish Yglesias, PA-C, working with Richardean Canalavid H Yao, MD by Arlan OrganAshley Leger, ED Scribe. This patient was seen in room WTR5/WTR5 and the patient's care was started at 5:51 PM.   Chief Complaint  Patient presents with  . URI  . Wheezing   The history is provided by the patient. No language interpreter was used.    HPI Comments: Angela Hoffman is a 48 y.o. Female with a PMHx of Asthma and HTN who presents to the Emergency Department complaining of a ongoing productive cough consisting of yellow sputum x 1 week that is progressively worsening. Pt also reports associated wheezing, nasal congestion, rhinorrhea, and sneezing. At this time she denies any fever or chills. Pt has no other concerns this visit.   Past Medical History  Diagnosis Date  . Asthma   . Hypertension    Past Surgical History  Procedure Laterality Date  . Cholecystectomy    . Tonsillectomy    . Tubal ligation    . Ankle surgery    . Tubal ligation    . Ankle surgery     No family history on file. History  Substance Use Topics  . Smoking status: Former Games developermoker  . Smokeless tobacco: Not on file  . Alcohol Use: No   OB History   Grav Para Term Preterm Abortions TAB SAB Ect Mult Living                 Review of Systems  Constitutional: Negative for fever and chills.  HENT: Positive for congestion, rhinorrhea and sneezing.   Eyes: Negative for redness.  Respiratory: Positive for cough.   Skin: Negative for rash.  Psychiatric/Behavioral: Negative for confusion.      Allergies  Review of patient's allergies indicates no known allergies.  Home Medications   Current Outpatient Rx  Name  Route  Sig  Dispense  Refill  . albuterol (PROVENTIL HFA;VENTOLIN HFA) 108 (90 BASE) MCG/ACT inhaler   Inhalation   Inhale 2 puffs into the lungs every 6 (six) hours as needed. For wheezing          . chlorpheniramine-HYDROcodone (TUSSIONEX PENNKINETIC ER) 10-8 MG/5ML LQCR   Oral   Take 5 mLs by mouth every 12 (twelve) hours as needed.   30 mL   0    Triage Vitals: BP 151/84  Pulse 82  Temp(Src) 98.7 F (37.1 C) (Oral)  Resp 16  SpO2 96%   Physical Exam  Nursing note and vitals reviewed. Constitutional: She is oriented to person, place, and time. She appears well-developed and well-nourished.  HENT:  Head: Normocephalic and atraumatic.  Eyes: EOM are normal.  Neck: Normal range of motion.  Cardiovascular: Normal rate, regular rhythm and normal heart sounds.   Pulmonary/Chest: Effort normal. She has wheezes.  Rhonchi and wheezing throughout lungs  Musculoskeletal: Normal range of motion.  Neurological: She is alert and oriented to person, place, and time.  Skin: Skin is warm and dry.  Psychiatric: She has a normal mood and affect. Her behavior is normal.    ED Course  Procedures (including critical care time)  DIAGNOSTIC STUDIES: Oxygen Saturation is 96% on RA, adequate by my interpretation.    COORDINATION OF CARE: 5:52 PM-Will give albuterol treatment. Will order chest X-Ray. Discussed treatment plan with pt at bedside and pt agreed to plan.     Labs  Review Labs Reviewed - No data to display Imaging Review No results found.   EKG Interpretation None      MDM   Final diagnoses:  Bronchitis  Asthma  Nasal congestion   Patient with asthma,\ Treated with decadron and neb treatment. D/c with hycodan.  Pt CXR negative for acute infiltrate. Patients symptoms are consistent with URI, likely viral etiology. Discussed that antibiotics are not indicated for viral infections. Pt will be discharged with symptomatic treatment.  Verbalizes understanding and is agreeable with plan. Pt is hemodynamically stable & in NAD prior to dc.   I personally performed the services described in this documentation, which was scribed in my presence. The recorded information has  been reviewed and is accurate.    Arthor Captain, PA-C 07/29/13 1955

## 2013-07-31 ENCOUNTER — Emergency Department (HOSPITAL_COMMUNITY)
Admission: EM | Admit: 2013-07-31 | Discharge: 2013-07-31 | Disposition: A | Discharge status: Home / Self Care | Payer: Medicaid Other | Attending: Emergency Medicine | Admitting: Emergency Medicine

## 2013-07-31 ENCOUNTER — Encounter (HOSPITAL_COMMUNITY): Payer: Self-pay | Admitting: Emergency Medicine

## 2013-07-31 ENCOUNTER — Emergency Department (HOSPITAL_COMMUNITY): Payer: Medicaid Other

## 2013-07-31 DIAGNOSIS — R109 Unspecified abdominal pain: Secondary | ICD-10-CM | POA: Insufficient documentation

## 2013-07-31 DIAGNOSIS — J45901 Unspecified asthma with (acute) exacerbation: Secondary | ICD-10-CM | POA: Insufficient documentation

## 2013-07-31 DIAGNOSIS — I10 Essential (primary) hypertension: Secondary | ICD-10-CM | POA: Insufficient documentation

## 2013-07-31 DIAGNOSIS — Z87891 Personal history of nicotine dependence: Secondary | ICD-10-CM | POA: Insufficient documentation

## 2013-07-31 DIAGNOSIS — Z79899 Other long term (current) drug therapy: Secondary | ICD-10-CM | POA: Insufficient documentation

## 2013-07-31 DIAGNOSIS — R197 Diarrhea, unspecified: Secondary | ICD-10-CM | POA: Insufficient documentation

## 2013-07-31 DIAGNOSIS — R42 Dizziness and giddiness: Secondary | ICD-10-CM | POA: Insufficient documentation

## 2013-07-31 LAB — BASIC METABOLIC PANEL
BUN: 13 mg/dL (ref 6–23)
CHLORIDE: 100 meq/L (ref 96–112)
CO2: 25 meq/L (ref 19–32)
Calcium: 9.4 mg/dL (ref 8.4–10.5)
Creatinine, Ser: 0.57 mg/dL (ref 0.50–1.10)
GFR calc non Af Amer: >90 mL/min (ref >90)
Glucose, Bld: 109 mg/dL — ABNORMAL HIGH (ref 70–99)
POTASSIUM: 3.2 meq/L — AB (ref 3.7–5.3)
Sodium: 139 mEq/L (ref 137–147)

## 2013-07-31 LAB — CBC
HEMATOCRIT: 34.6 % — AB (ref 36.0–46.0)
Hemoglobin: 11.8 g/dL — ABNORMAL LOW (ref 12.0–15.0)
MCH: 28.1 pg (ref 26.0–34.0)
MCHC: 34.1 g/dL (ref 30.0–36.0)
MCV: 82.4 fL (ref 78.0–100.0)
PLATELETS: 370 10*3/uL (ref 150–400)
RBC: 4.2 MIL/uL (ref 3.87–5.11)
RDW: 15.5 % (ref 11.5–15.5)
WBC: 12.5 10*3/uL — AB (ref 4.0–10.5)

## 2013-07-31 MED ORDER — ALBUTEROL SULFATE (2.5 MG/3ML) 0.083% IN NEBU
10.0000 mg | INHALATION_SOLUTION | Freq: Once | RESPIRATORY_TRACT | Status: DC
Start: 2013-07-31 — End: 2013-07-31
  Filled 2013-07-31: qty 12

## 2013-07-31 MED ORDER — PREDNISONE 20 MG PO TABS
60.0000 mg | ORAL_TABLET | Freq: Once | ORAL | Status: AC
  Administered 2013-07-31: 60 mg via ORAL
  Filled 2013-07-31: qty 3

## 2013-07-31 MED ORDER — PREDNISONE 10 MG PO TABS: 60.0000 mg | ORAL_TABLET | Freq: Every day | ORAL | Status: DC

## 2013-07-31 MED ORDER — IPRATROPIUM BROMIDE 0.02 % IN SOLN
0.5000 mg | Freq: Once | RESPIRATORY_TRACT | Status: AC
  Administered 2013-07-31: 0.5 mg via RESPIRATORY_TRACT
  Filled 2013-07-31: qty 2.5

## 2013-07-31 MED ORDER — ALBUTEROL SULFATE HFA 108 (90 BASE) MCG/ACT IN AERS: 2.0000 | INHALATION_SPRAY | RESPIRATORY_TRACT | Status: DC | PRN

## 2013-07-31 MED ORDER — ALBUTEROL (5 MG/ML) CONTINUOUS INHALATION SOLN
10.0000 mg/h | INHALATION_SOLUTION | RESPIRATORY_TRACT | Status: DC
  Administered 2013-07-31: 10 mg/h via RESPIRATORY_TRACT
  Filled 2013-07-31: qty 20

## 2013-07-31 NOTE — ED Notes (Signed)
Patient transported to X-ray 

## 2013-07-31 NOTE — Progress Notes (Signed)
   CARE MANAGEMENT ED NOTE 07/31/2013  Patient:  Angela Hoffman,Angela Hoffman   Account Number:  [id number]  Date Initiated:  07/31/2013  Documentation initiated by:  Edd ArbourGIBBS,Law Corsino  Subjective/Objective Assessment:   48 yr old female no pcp listed Pt states pcp is dr Charyl DancerGant     Subjective/Objective Assessment Detail:     Action/Plan:   epic updated   Action/Plan Detail:   Anticipated DC Date:  07/31/2013     Status Recommendation to Physician:   Result of Recommendation:    Other ED Services  Consult Working Plan    DC Planning Services  Other  Outpatient Services - Pt will follow up  PCP issues    Choice offered to / List presented to:            Status of service:  Completed, signed off  ED Comments:   ED Comments Detail:

## 2013-07-31 NOTE — Discharge Instructions (Signed)
Bronchospasm, Adult A bronchospasm is a spasm or tightening of the airways going into the lungs. During a bronchospasm breathing becomes more difficult because the airways get smaller. When this happens there can be coughing, a whistling sound when breathing (wheezing), and difficulty breathing. Bronchospasm is often associated with asthma, but not all patients who experience a bronchospasm have asthma. CAUSES  A bronchospasm is caused by inflammation or irritation of the airways. The inflammation or irritation may be triggered by:   Allergies (such as to animals, pollen, food, or mold). Allergens that cause bronchospasm may cause wheezing immediately after exposure or many hours later.   Infection. Viral infections are believed to be the most common cause of bronchospasm.   Exercise.   Irritants (such as pollution, cigarette smoke, strong odors, aerosol sprays, and paint fumes).   Weather changes. Winds increase molds and pollens in the air. Rain refreshes the air by washing irritants out. Cold air may cause inflammation.   Stress and emotional upset.  SIGNS AND SYMPTOMS   Wheezing.   Excessive nighttime coughing.   Frequent or severe coughing with a simple cold.   Chest tightness.   Shortness of breath.  DIAGNOSIS  Bronchospasm is usually diagnosed through a history and physical exam. Tests, such as chest X-rays, are sometimes done to look for other conditions. TREATMENT   Inhaled medicines can be given to open up your airways and help you breathe. The medicines can be given using either an inhaler or a nebulizer machine.  Corticosteroid medicines may be given for severe bronchospasm, usually when it is associated with asthma. HOME CARE INSTRUCTIONS   Always have a plan prepared for seeking medical care. Know when to call your health care provider and local emergency services (911 in the [place] Know where you can access local emergency care.  Only take medicines as  directed by your health care provider.  If you were prescribed an inhaler or nebulizer machine, ask your health care provider to explain how to use it correctly. Always use a spacer with your inhaler if you were given one.  It is necessary to remain calm during an attack. Try to relax and breathe more slowly.  Control your home environment in the following ways:   Change your heating and air conditioning filter at least once a month.   Limit your use of fireplaces and wood stoves.  Do not smoke and do not allow smoking in your home.   Avoid exposure to perfumes and fragrances.   Get rid of pests (such as roaches and mice) and their droppings.   Throw away plants if you see mold on them.   Keep your house clean and dust free.   Replace carpet with wood, tile, or vinyl flooring. Carpet can trap dander and dust.   Use allergy-proof pillows, mattress covers, and box spring covers.   Wash bed sheets and blankets every week in hot water and dry them in a dryer.   Use blankets that are made of polyester or cotton.   Wash hands frequently. SEEK MEDICAL CARE IF:   You have muscle aches.   You have chest pain.   The sputum changes from clear or white to yellow, green, gray, or bloody.   The sputum you cough up gets thicker.   There are problems that may be related to the medicine you are given, such as a rash, itching, swelling, or trouble breathing.  SEEK IMMEDIATE MEDICAL CARE IF:   You have worsening wheezing and coughing   even after taking your prescribed medicines.   You have increased difficulty breathing.   You develop severe chest pain. MAKE SURE YOU:   Understand these instructions.  Will watch your condition.  Will get help right away if you are not doing well or get worse. Document Released: 05/07/2003 Document Revised: 01/04/2013 Document Reviewed: 10/24/2012 [employer] Patient Information 2014 [employer].  

## 2013-07-31 NOTE — ED Provider Notes (Signed)
CSN: [id number]     Arrival date & time 07/31/13  1101 History   First MD Initiated Contact with Patient 07/31/13 1208     Chief Complaint  Patient presents with  . Asthma  . Dizziness  . Abdominal Cramping  . Diarrhea  . Cough     HPI She reports some shortness of breath and wheezing.  She was seen several days ago for asthma and was discharged home with an albuterol inhaler.  She's also sent home with Hycodan which she states is not helping her cough.  She was not sent home on steroids.  She has a history of asthma and hypertension.  She also reports some diarrhea and occasional abdominal cramping.  She's had upper respiratory symptoms over the past several days.  No fever or chills.  No unilateral leg swelling.  History of DVT or pulmonary embolism.  She denies orthopnea.  No significant dyspnea on exertion.  She is not from the area.  Past Medical History  Diagnosis Date  . Asthma   . Hypertension    Past Surgical History  Procedure Laterality Date  . Cholecystectomy    . Tonsillectomy    . Tubal ligation    . Ankle surgery    . Tubal ligation    . Ankle surgery     History reviewed. No pertinent family history. History  Substance Use Topics  . Smoking status: Former Games developermoker  . Smokeless tobacco: Not on file  . Alcohol Use: No   OB History   Grav Para Term Preterm Abortions TAB SAB Ect Mult Living                 Review of Systems  All other systems reviewed and are negative.      Allergies  Review of patient's allergies indicates no known allergies.  Home Medications   Current Outpatient Rx  Name  Route  Sig  Dispense  Refill  . albuterol (PROVENTIL HFA;VENTOLIN HFA) 108 (90 BASE) MCG/ACT inhaler   Inhalation   Inhale 2 puffs into the lungs every 6 (six) hours as needed. For wheezing         . HYDROcodone-homatropine (HYCODAN) 5-1.5 MG/5ML syrup   Oral   Take 5 mLs by mouth every 6 (six) hours as needed for cough.   30 mL   0   . albuterol  (PROVENTIL HFA;VENTOLIN HFA) 108 (90 BASE) MCG/ACT inhaler   Inhalation   Inhale 2 puffs into the lungs every 4 (four) hours as needed for wheezing or shortness of breath.   1 Inhaler   0   . predniSONE (DELTASONE) 10 MG tablet   Oral   Take 6 tablets (60 mg total) by mouth daily.   30 tablet   0    BP 147/116  Pulse 75  Temp(Src) 98.3 F (36.8 C) (Oral)  SpO2 94% Physical Exam  Nursing note and vitals reviewed. Constitutional: She is oriented to person, place, and time. She appears well-developed and well-nourished. No distress.  HENT:  Head: Normocephalic and atraumatic.  Eyes: EOM are normal.  Neck: Normal range of motion.  Cardiovascular: Normal rate, regular rhythm and normal heart sounds.   Pulmonary/Chest: Effort normal. She has wheezes.  Abdominal: Soft. She exhibits no distension. There is no tenderness.  Musculoskeletal: Normal range of motion.  Neurological: She is alert and oriented to person, place, and time.  Skin: Skin is warm and dry.  Psychiatric: She has a normal mood and affect. Judgment normal.  ED Course  Procedures (including critical care time) Labs Review Labs Reviewed  CBC - Abnormal; Notable for the following:    WBC 12.5 (*)    Hemoglobin 11.8 (*)    HCT 34.6 (*)    All other components within normal limits  BASIC METABOLIC PANEL - Abnormal; Notable for the following:    Potassium 3.2 (*)    Glucose, Bld 109 (*)    All other components within normal limits   Imaging Review Dg Chest 2 View  07/31/2013   CLINICAL DATA:  Asthma, shortness of breath, cramping  EXAM: CHEST  2 VIEW  COMPARISON:  07/29/2013  FINDINGS: Cardiomediastinal silhouette is stable. No acute infiltrate or pleural effusion. No pulmonary edema. Degenerative changes thoracic spine.  IMPRESSION: No active cardiopulmonary disease. Degenerative changes thoracic spine.   Electronically Signed   By: Natasha Mead M.D.   On: 07/31/2013 16:19   Dg Chest 2 View  07/29/2013    CLINICAL DATA:  Cough and congestion, shortness of breath  EXAM: CHEST  2 VIEW  COMPARISON:  DG CHEST 2 VIEW dated 07/01/2011  FINDINGS: The heart size and mediastinal contours are within normal limits. Both lungs are clear. The visualized skeletal structures are unremarkable.  IMPRESSION: No active cardiopulmonary disease.   Electronically Signed   By: Christiana Pellant M.D.   On: 07/29/2013 17:51  I personally reviewed the imaging tests through PACS system I reviewed available ER/hospitalization records through the EMR    EKG Interpretation   Date/Time:  Monday July 31 2013 12:02:24 EDT Ventricular Rate:  69 PR Interval:  147 QRS Duration: 90 QT Interval:  413 QTC Calculation: 442 R Axis:   87 Text Interpretation:  Sinus rhythm No significant change was found  Confirmed by Shalane Florendo  MD, Caryn Bee ([id number]) on 07/31/2013 4:26:37 PM      MDM   Final diagnoses:  Asthma exacerbation    The patient is beginning to have some improvement in her symptoms at this time.  She sounds much better after albuterol and Atrovent.  Patient is sent home on steroids last time.  I think she will benefit from a burst of prednisone.  Plan will be discharged home in good condition.  Asked that she return to the ER for new or worsening symptoms.    Lyanne Co, MD 07/31/13 [id number]

## 2013-07-31 NOTE — ED Notes (Signed)
Patient states having difficult breathing due to asthma and has been having loose stools, abdominal cramping. Patient stated she blew her nose and it was red and yellow in color. [facility].

## 2013-08-10 ENCOUNTER — Encounter: Payer: Self-pay | Admitting: Physical Medicine & Rehabilitation

## 2013-09-18 ENCOUNTER — Other Ambulatory Visit: Payer: Self-pay | Admitting: Physical Medicine & Rehabilitation

## 2013-09-18 ENCOUNTER — Encounter: Payer: Self-pay | Admitting: Physical Medicine & Rehabilitation

## 2013-09-18 ENCOUNTER — Ambulatory Visit (HOSPITAL_BASED_OUTPATIENT_CLINIC_OR_DEPARTMENT_OTHER): Payer: Medicaid Other | Admitting: Physical Medicine & Rehabilitation

## 2013-09-18 ENCOUNTER — Encounter: Payer: Medicaid Other | Attending: Physical Medicine & Rehabilitation

## 2013-09-18 VITALS — BP 163/95 | HR 91 | Resp 14 | Ht 59.5 in | Wt 162.6 lb

## 2013-09-18 DIAGNOSIS — G8929 Other chronic pain: Secondary | ICD-10-CM | POA: Insufficient documentation

## 2013-09-18 DIAGNOSIS — M25569 Pain in unspecified knee: Secondary | ICD-10-CM

## 2013-09-18 DIAGNOSIS — M25561 Pain in right knee: Secondary | ICD-10-CM | POA: Insufficient documentation

## 2013-09-18 DIAGNOSIS — M25562 Pain in left knee: Principal | ICD-10-CM

## 2013-09-18 DIAGNOSIS — M1710 Unilateral primary osteoarthritis, unspecified knee: Secondary | ICD-10-CM | POA: Insufficient documentation

## 2013-09-18 DIAGNOSIS — Z79899 Other long term (current) drug therapy: Secondary | ICD-10-CM

## 2013-09-18 DIAGNOSIS — Z96659 Presence of unspecified artificial knee joint: Secondary | ICD-10-CM | POA: Insufficient documentation

## 2013-09-18 DIAGNOSIS — I10 Essential (primary) hypertension: Secondary | ICD-10-CM | POA: Insufficient documentation

## 2013-09-18 DIAGNOSIS — M24569 Contracture, unspecified knee: Secondary | ICD-10-CM

## 2013-09-18 DIAGNOSIS — M24562 Contracture, left knee: Secondary | ICD-10-CM

## 2013-09-18 DIAGNOSIS — Z5181 Encounter for therapeutic drug level monitoring: Secondary | ICD-10-CM

## 2013-09-18 DIAGNOSIS — M549 Dorsalgia, unspecified: Secondary | ICD-10-CM

## 2013-09-18 DIAGNOSIS — M24561 Contracture, right knee: Secondary | ICD-10-CM | POA: Insufficient documentation

## 2013-09-18 DIAGNOSIS — G8928 Other chronic postprocedural pain: Secondary | ICD-10-CM | POA: Insufficient documentation

## 2013-09-18 MED ORDER — GABAPENTIN 400 MG PO CAPS: 400.0000 mg | ORAL_CAPSULE | Freq: Four times a day (QID) | ORAL | Status: DC

## 2013-09-18 MED ORDER — TRAMADOL HCL 50 MG PO TABS: 50.0000 mg | ORAL_TABLET | Freq: Four times a day (QID) | ORAL | Status: DC

## 2013-09-18 NOTE — Progress Notes (Signed)
Subjective:    Patient ID: Angela Hoffman, female    DOB: 11/19/1965, 48 y.o.   MRN: [id number]  HPI CC: R>L  Knee pain Patient with osteoarthritis in both knees. 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 Continues to have postoperative pain. [place] [facility] [place]. Patient had about 4 therapy visits postoperatively  Patient had been prescribed postoperative oxycodone 10 mg [place]. [place] but does not have a new primary care physician  Patient is on gabapentin 300 mg which are somewhat helpful for knee pain Continues take Motrin 800 mg takes 3-4 tablets per day Uses prednisone 10 mg as needed for asthma for flareups  Has tried other medicines such as a hydrocodone as well as Tylenol 3. She took Tylenol 3 after a carpal tunnel surgery on the left wrist October 30 ,2014 Pain Inventory Average Pain 7 Pain Right Now 7 My pain is aching  In the last 24 hours, has pain interfered with the following? General activity 3 Relation with others 3 Enjoyment of life 3 What TIME of day is your pain at its worst? daytime Sleep (in general) Poor  Pain is worse with: walking and standing Pain improves with: heat/ice and medication Relief from Meds: did not answer  Mobility walk without assistance use a cane how many minutes can you walk? 5-10 ability to climb steps?  yes do you drive?  no  Function not employed: date last employed . I need assistance with the following:  dressing and household duties  Neuro/Psych No problems in this area  Prior Studies Any changes since last visit?  no  Physicians involved in your care Any changes since last visit?  no   History reviewed. No pertinent family history. History   Social History  . Marital Status: Single    Spouse Name: N/A    Number of Children: N/A  . Years of Education: N/A    Social History Main Topics  . Smoking status: Former Games developermoker  . Smokeless tobacco: None  . Alcohol Use: No  . Drug Use: No  . Sexual Activity:    Other Topics Concern  . None   Social History Narrative  . None   Past Surgical History  Procedure Laterality Date  . Cholecystectomy    . Tonsillectomy    . Tubal ligation    . Ankle surgery    . Tubal ligation    . Ankle surgery    . Replacement total knee bilateral Bilateral    Past Medical History  Diagnosis Date  . Asthma   . Hypertension    BP 163/95  Pulse 91  Resp 14  Ht 4' 11.5" (1.511 m)  Wt 162 lb 9.6 oz (73.755 kg)  BMI 32.30 kg/m2  SpO2 99%  Opioid Risk Score: 1 Fall Risk Score: Low Fall Risk (0-5 points) (educated and handout given for fall prevention in the home)  Review of Systems  Musculoskeletal: Positive for back pain.       Knee pain  All other systems reviewed and are negative.      Objective:   Physical Exam  Nursing note and vitals reviewed. Constitutional: She is oriented to person, place, and time. She appears well-developed and well-nourished.  HENT:  Head: Atraumatic.  Eyes: Conjunctivae and EOM are normal. Pupils are equal, round, and reactive to light.  Musculoskeletal:  Lumbar back: She exhibits decreased range of motion. She exhibits no tenderness, no deformity and no pain.  Right knee -30 extension, 90 flexion Left knee -12 extension, 60 flexion No knee effusion no erythema Mild warmth right knee  Hip range of motion good internal/external rotation right and left  Decreased sensation of left C7 left C8 dermatomes Intact sensation bilateral L3-L4 L5-S1 dermatomes  Neurological: She is alert and oriented to person, place, and time. She has normal reflexes. No sensory deficit. Gait abnormal.  Decreased right knee extension during ambulation  Psychiatric: Her affect is inappropriate. She is slowed.  Inappropriate laughing She is inattentive.          Assessment &  Plan:  1. 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 Patient has had postoperative follow up with orthopedist however no recent x-rays. Will order  Patient did not have many therapy visits postoperatively due to insurance issues. This may have resulted in contractures Do not think that further therapy would help with knee contractures they appear to be fixed  Opioid risk is low Discussed narcotic analgesic medications. Will start on tramadol which the patient states did not help much in the past. If urine drug screen is consistent, would call him Tylenol #3 We also discussed interventions such as genicular nerve blocks under fluoroscopic guidance. Will address at future visit in one month

## 2013-09-25 NOTE — Progress Notes (Signed)
Urine drug screen from 09/18/2013 was consistent.

## 2013-09-29 ENCOUNTER — Ambulatory Visit (HOSPITAL_COMMUNITY)
Admission: RE | Admit: 2013-09-29 | Discharge: 2013-09-29 | Disposition: A | Discharge status: Home / Self Care | Payer: Medicaid Other | Source: Ambulatory Visit | Attending: Physical Medicine & Rehabilitation | Admitting: Physical Medicine & Rehabilitation

## 2013-09-29 ENCOUNTER — Telehealth: Payer: Self-pay | Admitting: *Deleted

## 2013-09-29 DIAGNOSIS — M25561 Pain in right knee: Secondary | ICD-10-CM

## 2013-09-29 DIAGNOSIS — M25569 Pain in unspecified knee: Secondary | ICD-10-CM | POA: Insufficient documentation

## 2013-09-29 DIAGNOSIS — M25469 Effusion, unspecified knee: Secondary | ICD-10-CM | POA: Insufficient documentation

## 2013-09-29 DIAGNOSIS — Z96659 Presence of unspecified artificial knee joint: Secondary | ICD-10-CM | POA: Insufficient documentation

## 2013-09-29 DIAGNOSIS — M25562 Pain in left knee: Principal | ICD-10-CM

## 2013-09-29 MED ORDER — ACETAMINOPHEN-CODEINE #3 300-30 MG PO TABS: 1.0000 | ORAL_TABLET | Freq: Three times a day (TID) | ORAL | Status: DC

## 2013-09-29 NOTE — Telephone Encounter (Signed)
Ordered Tylenol #3 1 po TID #90 no add'l refills. Phoned in at [facility].

## 2013-09-29 NOTE — Telephone Encounter (Signed)
Calling to find out about urine test.  It was consistent.  Per Dr Wynn BankerKirsteins note we can give Tyl #3 if consistent.  Please advise sig and disp.

## 2013-09-29 NOTE — Telephone Encounter (Signed)
T#3 1 po TID, #90 no RF

## 2013-10-23 ENCOUNTER — Encounter: Payer: Self-pay | Admitting: Physical Medicine & Rehabilitation

## 2013-10-23 ENCOUNTER — Encounter: Payer: Medicaid Other | Attending: Physical Medicine & Rehabilitation

## 2013-10-23 ENCOUNTER — Ambulatory Visit (HOSPITAL_BASED_OUTPATIENT_CLINIC_OR_DEPARTMENT_OTHER): Payer: Medicaid Other | Admitting: Physical Medicine & Rehabilitation

## 2013-10-23 VITALS — BP 181/85 | HR 91 | Resp 14 | Ht 59.0 in | Wt 162.0 lb

## 2013-10-23 DIAGNOSIS — I10 Essential (primary) hypertension: Secondary | ICD-10-CM | POA: Insufficient documentation

## 2013-10-23 DIAGNOSIS — G8929 Other chronic pain: Secondary | ICD-10-CM

## 2013-10-23 DIAGNOSIS — Z96659 Presence of unspecified artificial knee joint: Secondary | ICD-10-CM | POA: Insufficient documentation

## 2013-10-23 DIAGNOSIS — M1710 Unilateral primary osteoarthritis, unspecified knee: Secondary | ICD-10-CM | POA: Insufficient documentation

## 2013-10-23 DIAGNOSIS — Z79899 Other long term (current) drug therapy: Secondary | ICD-10-CM | POA: Insufficient documentation

## 2013-10-23 DIAGNOSIS — M549 Dorsalgia, unspecified: Secondary | ICD-10-CM

## 2013-10-23 DIAGNOSIS — M25569 Pain in unspecified knee: Secondary | ICD-10-CM | POA: Insufficient documentation

## 2013-10-23 DIAGNOSIS — M24561 Contracture, right knee: Secondary | ICD-10-CM

## 2013-10-23 DIAGNOSIS — M24569 Contracture, unspecified knee: Secondary | ICD-10-CM

## 2013-10-23 DIAGNOSIS — G8928 Other chronic postprocedural pain: Secondary | ICD-10-CM

## 2013-10-23 DIAGNOSIS — M24562 Contracture, left knee: Secondary | ICD-10-CM

## 2013-10-23 MED ORDER — ACETAMINOPHEN-CODEINE #4 300-60 MG PO TABS: 1.0000 | ORAL_TABLET | Freq: Four times a day (QID) | ORAL | Status: DC | PRN

## 2013-10-23 NOTE — Patient Instructions (Signed)
May get back x-rays prior to next visit, will review them. Consider injections

## 2013-10-23 NOTE — Progress Notes (Signed)
Subjective:    Patient ID: Angela LaniusMelinda Cupps, female    DOB: 02/06/1966, 48 y.o.   MRN: [id number]  HPI Patient with osteoarthritis in both knees. 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 Continues to have postoperative pain. [place] [facility] [place] Patient had about 4 therapy visits postoperatively  Patient is on gabapentin 300 mg which are somewhat helpful for knee pain  Continues take Motrin 800 mg as needed not everyday Uses prednisone 10 mg as needed for asthma for flareups  C/o knee pain goes to bed around 11 PM wakes up with knee pain around 2 AM. Taking Tylenol #3 3 times per day  Patient also has a history of back pain. She reportedly received back injections at a previous pain clinic. She is interested in additional injections. No recent imaging studies. No recent trauma  Pain Inventory Average Pain 9 Pain Right Now 9 My pain is constant and aching  In the last 24 hours, has pain interfered with the following? General activity 7 Relation with others 7 Enjoyment of life 7 What TIME of day is your pain at its worst? daytime Sleep (in general) Fair  Pain is worse with: n/a Pain improves with: n/a Relief from Meds: 0  Mobility use a cane how many minutes can you walk? 5 do you drive?  no  Function disabled: date disabled . I need assistance with the following:  dressing, bathing and household duties Do you have any goals in this area?  yes  Neuro/Psych trouble walking anxiety  Prior Studies Any changes since last visit?  no  Physicians involved in your care Any changes since last visit?  no   History reviewed. No pertinent family history. History   Social History  . Marital Status: Single    Spouse Name: N/A    Number of Children: N/A  . Years of Education: N/A   Social History Main Topics  . Smoking status: Former Games developermoker  . Smokeless tobacco: None  . Alcohol  Use: No  . Drug Use: No  . Sexual Activity:    Other Topics Concern  . None   Social History Narrative  . None   Past Surgical History  Procedure Laterality Date  . Cholecystectomy    . Tonsillectomy    . Tubal ligation    . Ankle surgery    . Tubal ligation    . Ankle surgery    . Replacement total knee bilateral Bilateral    Past Medical History  Diagnosis Date  . Asthma   . Hypertension    BP 181/85  Pulse 91  Resp 14  Ht 4\' 11"  (1.499 m)  Wt 162 lb (73.483 kg)  BMI 32.70 kg/m2  SpO2 98%  Opioid Risk Score:   Fall Risk Score: Low Fall Risk (0-5 points) (patient educated handout declined)    Review of Systems  Respiratory: Positive for cough and wheezing.   Musculoskeletal: Positive for gait problem.  Psychiatric/Behavioral: The patient is nervous/anxious.   All other systems reviewed and are negative.      Objective:   Physical Exam  Patient lacks full extension -20 on the left -30 on the right  Left knee flexion is 69 Right knee flexion 97  No tenderness over the knee joints No tenderness in the popliteal fossa No evidence of knee effusion. No evidence of skin lesions on the knee, scars are well-healed  Discussed x-rays the hardware  looks in place in both knees. No fractures noted      Assessment & Plan:  #1. Chronic postoperative knee pain bilateral knee x-rays show no evidence of loosening or evidence of prosthetic infection. Patient has severe contracture in bilateral knees which is likely accounting for much of her pain given the biomechanical abnormalities  Will change Tylenol 3 to Tylenol #4 and increase from 3 times a day to 4 times a day   2. Chronic low back pain checks x-rays and followup next visit

## 2013-10-27 ENCOUNTER — Encounter (HOSPITAL_COMMUNITY): Payer: Self-pay | Admitting: Emergency Medicine

## 2013-10-27 ENCOUNTER — Emergency Department (HOSPITAL_COMMUNITY): Payer: Medicaid Other

## 2013-10-27 ENCOUNTER — Inpatient Hospital Stay (HOSPITAL_COMMUNITY)
Admission: EM | Admit: 2013-10-27 | Discharge: 2013-10-30 | DRG: 203 | Disposition: A | Discharge status: Home / Self Care | Payer: Medicaid Other | Attending: Internal Medicine | Admitting: Internal Medicine

## 2013-10-27 DIAGNOSIS — T380X5A Adverse effect of glucocorticoids and synthetic analogues, initial encounter: Secondary | ICD-10-CM | POA: Diagnosis present

## 2013-10-27 DIAGNOSIS — IMO0002 Reserved for concepts with insufficient information to code with codable children: Secondary | ICD-10-CM

## 2013-10-27 DIAGNOSIS — R7309 Other abnormal glucose: Secondary | ICD-10-CM | POA: Diagnosis present

## 2013-10-27 DIAGNOSIS — Z9851 Tubal ligation status: Secondary | ICD-10-CM

## 2013-10-27 DIAGNOSIS — D72829 Elevated white blood cell count, unspecified: Secondary | ICD-10-CM | POA: Diagnosis present

## 2013-10-27 DIAGNOSIS — Z9089 Acquired absence of other organs: Secondary | ICD-10-CM

## 2013-10-27 DIAGNOSIS — Z96659 Presence of unspecified artificial knee joint: Secondary | ICD-10-CM

## 2013-10-27 DIAGNOSIS — F411 Generalized anxiety disorder: Secondary | ICD-10-CM | POA: Diagnosis present

## 2013-10-27 DIAGNOSIS — R000 Tachycardia, unspecified: Secondary | ICD-10-CM | POA: Diagnosis present

## 2013-10-27 DIAGNOSIS — J328 Other chronic sinusitis: Secondary | ICD-10-CM | POA: Diagnosis present

## 2013-10-27 DIAGNOSIS — Z79899 Other long term (current) drug therapy: Secondary | ICD-10-CM

## 2013-10-27 DIAGNOSIS — J45901 Unspecified asthma with (acute) exacerbation: Principal | ICD-10-CM | POA: Diagnosis present

## 2013-10-27 DIAGNOSIS — J45909 Unspecified asthma, uncomplicated: Secondary | ICD-10-CM | POA: Diagnosis present

## 2013-10-27 DIAGNOSIS — I10 Essential (primary) hypertension: Secondary | ICD-10-CM | POA: Diagnosis present

## 2013-10-27 DIAGNOSIS — Z87891 Personal history of nicotine dependence: Secondary | ICD-10-CM

## 2013-10-27 DIAGNOSIS — Z791 Long term (current) use of non-steroidal anti-inflammatories (NSAID): Secondary | ICD-10-CM

## 2013-10-27 LAB — URINALYSIS, ROUTINE W REFLEX MICROSCOPIC
Bilirubin Urine: NEGATIVE
Glucose, UA: NEGATIVE mg/dL
HGB URINE DIPSTICK: NEGATIVE
Ketones, ur: NEGATIVE mg/dL
Leukocytes, UA: NEGATIVE
NITRITE: NEGATIVE
Protein, ur: NEGATIVE mg/dL
SPECIFIC GRAVITY, URINE: 1.025 (ref 1.005–1.030)
Urobilinogen, UA: 0.2 mg/dL (ref 0.0–1.0)
pH: 6.5 (ref 5.0–8.0)

## 2013-10-27 LAB — CBC
HCT: 37.5 % (ref 36.0–46.0)
Hemoglobin: 12.9 g/dL (ref 12.0–15.0)
MCH: 28.5 pg (ref 26.0–34.0)
MCHC: 34.4 g/dL (ref 30.0–36.0)
MCV: 82.8 fL (ref 78.0–100.0)
Platelets: 384 10*3/uL (ref 150–400)
RBC: 4.53 MIL/uL (ref 3.87–5.11)
RDW: 14.8 % (ref 11.5–15.5)
WBC: 12.5 10*3/uL — ABNORMAL HIGH (ref 4.0–10.5)

## 2013-10-27 LAB — BASIC METABOLIC PANEL
BUN: 13 mg/dL (ref 6–23)
CO2: 22 mEq/L (ref 19–32)
Calcium: 8.9 mg/dL (ref 8.4–10.5)
Chloride: 99 mEq/L (ref 96–112)
Creatinine, Ser: 0.64 mg/dL (ref 0.50–1.10)
GFR calc Af Amer: >90 mL/min (ref >90)
GFR calc non Af Amer: >90 mL/min (ref >90)
Glucose, Bld: 89 mg/dL (ref 70–99)
Potassium: 3.9 mEq/L (ref 3.7–5.3)
Sodium: 139 mEq/L (ref 137–147)

## 2013-10-27 LAB — RAPID URINE DRUG SCREEN, HOSP PERFORMED
Amphetamines: NOT DETECTED
Barbiturates: NOT DETECTED
Benzodiazepines: NOT DETECTED
COCAINE: NOT DETECTED
OPIATES: POSITIVE — AB
Tetrahydrocannabinol: NOT DETECTED

## 2013-10-27 MED ORDER — LISINOPRIL-HYDROCHLOROTHIAZIDE 20-25 MG PO TABS: 1.0000 | ORAL_TABLET | Freq: Every day | ORAL | Status: DC

## 2013-10-27 MED ORDER — GUAIFENESIN ER 600 MG PO TB12
600.0000 mg | ORAL_TABLET | Freq: Two times a day (BID) | ORAL | Status: DC
  Administered 2013-10-27 – 2013-10-30 (×7): 600 mg via ORAL
  Filled 2013-10-27 (×8): qty 1

## 2013-10-27 MED ORDER — PANTOPRAZOLE SODIUM 40 MG PO TBEC
40.0000 mg | DELAYED_RELEASE_TABLET | Freq: Every day | ORAL | Status: DC
  Administered 2013-10-27 – 2013-10-29 (×3): 40 mg via ORAL
  Filled 2013-10-27 (×3): qty 1

## 2013-10-27 MED ORDER — LEVOFLOXACIN 750 MG PO TABS
750.0000 mg | ORAL_TABLET | Freq: Every day | ORAL | Status: DC
  Administered 2013-10-27 – 2013-10-30 (×4): 750 mg via ORAL
  Filled 2013-10-27 (×4): qty 1

## 2013-10-27 MED ORDER — ENOXAPARIN SODIUM 40 MG/0.4ML ~~LOC~~ SOLN
40.0000 mg | SUBCUTANEOUS | Status: DC
  Administered 2013-10-27 – 2013-10-30 (×4): 40 mg via SUBCUTANEOUS
  Filled 2013-10-27 (×4): qty 0.4

## 2013-10-27 MED ORDER — LISINOPRIL 20 MG PO TABS
20.0000 mg | ORAL_TABLET | Freq: Every day | ORAL | Status: DC
  Administered 2013-10-27 – 2013-10-30 (×4): 20 mg via ORAL
  Filled 2013-10-27 (×4): qty 1

## 2013-10-27 MED ORDER — SODIUM CHLORIDE 0.9 % IJ SOLN: 3.0000 mL | INTRAMUSCULAR | Status: DC | PRN

## 2013-10-27 MED ORDER — IPRATROPIUM-ALBUTEROL 0.5-2.5 (3) MG/3ML IN SOLN
3.0000 mL | RESPIRATORY_TRACT | Status: DC
  Administered 2013-10-27: 3 mL via RESPIRATORY_TRACT
  Filled 2013-10-27: qty 3

## 2013-10-27 MED ORDER — ONDANSETRON HCL 4 MG/2ML IJ SOLN
4.0000 mg | Freq: Four times a day (QID) | INTRAMUSCULAR | Status: DC | PRN
  Administered 2013-10-28: 4 mg via INTRAVENOUS
  Filled 2013-10-27: qty 2

## 2013-10-27 MED ORDER — POLYETHYLENE GLYCOL 3350 17 G PO PACK
17.0000 g | PACK | Freq: Every day | ORAL | Status: DC
  Administered 2013-10-27 – 2013-10-30 (×4): 17 g via ORAL
  Filled 2013-10-27 (×4): qty 1

## 2013-10-27 MED ORDER — ACETAMINOPHEN-CODEINE #4 300-60 MG PO TABS
1.0000 | ORAL_TABLET | Freq: Four times a day (QID) | ORAL | Status: DC | PRN
  Administered 2013-10-27: 1 via ORAL
  Filled 2013-10-27: qty 1

## 2013-10-27 MED ORDER — ALBUTEROL (5 MG/ML) CONTINUOUS INHALATION SOLN
10.0000 mg/h | INHALATION_SOLUTION | RESPIRATORY_TRACT | Status: DC
  Administered 2013-10-27: 10 mg/h via RESPIRATORY_TRACT
  Filled 2013-10-27: qty 20

## 2013-10-27 MED ORDER — GABAPENTIN 400 MG PO CAPS
400.0000 mg | ORAL_CAPSULE | Freq: Four times a day (QID) | ORAL | Status: DC
  Administered 2013-10-27 – 2013-10-30 (×13): 400 mg via ORAL
  Filled 2013-10-27 (×15): qty 1

## 2013-10-27 MED ORDER — ONDANSETRON HCL 4 MG PO TABS
4.0000 mg | ORAL_TABLET | Freq: Four times a day (QID) | ORAL | Status: DC | PRN
  Filled 2013-10-27: qty 1

## 2013-10-27 MED ORDER — HYDRALAZINE HCL 20 MG/ML IJ SOLN
10.0000 mg | Freq: Three times a day (TID) | INTRAMUSCULAR | Status: DC | PRN
Start: 2013-10-27 — End: 2013-10-28
  Administered 2013-10-28: 10 mg via INTRAVENOUS
  Filled 2013-10-27: qty 0.5

## 2013-10-27 MED ORDER — SODIUM CHLORIDE 0.9 % IJ SOLN
3.0000 mL | Freq: Two times a day (BID) | INTRAMUSCULAR | Status: DC
  Administered 2013-10-27 – 2013-10-30 (×7): 3 mL via INTRAVENOUS

## 2013-10-27 MED ORDER — TRAMADOL HCL 50 MG PO TABS
50.0000 mg | ORAL_TABLET | Freq: Four times a day (QID) | ORAL | Status: DC
  Administered 2013-10-27 – 2013-10-30 (×13): 50 mg via ORAL
  Filled 2013-10-27 (×13): qty 1

## 2013-10-27 MED ORDER — METHYLPREDNISOLONE SODIUM SUCC 125 MG IJ SOLR
125.0000 mg | Freq: Once | INTRAMUSCULAR | Status: AC
  Administered 2013-10-27: 125 mg via INTRAVENOUS
  Filled 2013-10-27: qty 2

## 2013-10-27 MED ORDER — HYDROCODONE-ACETAMINOPHEN 5-325 MG PO TABS
1.0000 | ORAL_TABLET | ORAL | Status: DC | PRN
  Administered 2013-10-27 – 2013-10-28 (×5): 2 via ORAL
  Administered 2013-10-29: 1 via ORAL
  Administered 2013-10-29 – 2013-10-30 (×2): 2 via ORAL
  Filled 2013-10-27: qty 1
  Filled 2013-10-27 (×7): qty 2

## 2013-10-27 MED ORDER — ALBUTEROL SULFATE (2.5 MG/3ML) 0.083% IN NEBU
2.5000 mg | INHALATION_SOLUTION | RESPIRATORY_TRACT | Status: DC
  Administered 2013-10-27 – 2013-10-28 (×8): 2.5 mg via RESPIRATORY_TRACT
  Filled 2013-10-27 (×8): qty 3

## 2013-10-27 MED ORDER — ALPRAZOLAM 1 MG PO TABS
1.0000 mg | ORAL_TABLET | Freq: Two times a day (BID) | ORAL | Status: DC
  Administered 2013-10-27 – 2013-10-30 (×7): 1 mg via ORAL
  Filled 2013-10-27 (×7): qty 1

## 2013-10-27 MED ORDER — HYDROCODONE-ACETAMINOPHEN 5-325 MG PO TABS
1.0000 | ORAL_TABLET | Freq: Once | ORAL | Status: AC
  Administered 2013-10-27: 1 via ORAL
  Filled 2013-10-27: qty 1

## 2013-10-27 MED ORDER — SODIUM CHLORIDE 0.9 % IV SOLN: 250.0000 mL | INTRAVENOUS | Status: DC | PRN

## 2013-10-27 MED ORDER — HYDROCHLOROTHIAZIDE 25 MG PO TABS
25.0000 mg | ORAL_TABLET | Freq: Every day | ORAL | Status: DC
  Administered 2013-10-27 – 2013-10-30 (×4): 25 mg via ORAL
  Filled 2013-10-27 (×4): qty 1

## 2013-10-27 MED ORDER — IBUPROFEN 200 MG PO TABS
600.0000 mg | ORAL_TABLET | Freq: Once | ORAL | Status: AC
  Administered 2013-10-27: 600 mg via ORAL
  Filled 2013-10-27: qty 3

## 2013-10-27 MED ORDER — METHYLPREDNISOLONE SODIUM SUCC 125 MG IJ SOLR
60.0000 mg | Freq: Four times a day (QID) | INTRAMUSCULAR | Status: DC
  Administered 2013-10-27 – 2013-10-30 (×12): 60 mg via INTRAVENOUS
  Filled 2013-10-27 (×17): qty 0.96

## 2013-10-27 NOTE — ED Notes (Signed)
Attempted to call report to floor, RN unavailable at this time.  

## 2013-10-27 NOTE — H&P (Signed)
[facility] History and Physical  Angela LaniusMelinda Dimperio ZOX:[id number] DOB: 07/20/1965 DOA: 10/27/2013  Referring physician: ED  PCP: Tilda BurrowGANT, APRIL, FNP   Chief Complaint: SOB.   HPI: Angela Hoffman is a 48 y.o. female who presents complaining of shortness of breath, tightness for last 7 days, getting worse. She has been using her inhale without relieved. She relates productive cough, yellow phlegm, no fevers. She is complaining of ribs pain after coughing. She denies abdominal pain, diarrhea. She is feeling better, after multiples nebulizer treatments.    Review of Systems:  Negative, except as per HPI.   Past Medical History  Diagnosis Date  . Asthma   . Hypertension    Past Surgical History  Procedure Laterality Date  . Cholecystectomy    . Tonsillectomy    . Tubal ligation    . Ankle surgery    . Tubal ligation    . Ankle surgery    . Replacement total knee bilateral Bilateral    Social History:  reports that she has quit smoking. Her smoking use included Cigarettes. She smoked 0.00 packs per day for 7 years. She has never used smokeless tobacco. She reports that she does not drink alcohol or use illicit drugs.  No Known Allergies  History reviewed. No pertinent family history.   Prior to Admission medications   Medication Sig Start Date End Date Taking? Authorizing Provider  acetaminophen-codeine (TYLENOL #4) 300-60 MG per tablet Take 1 tablet by mouth every 6 (six) hours as needed for moderate pain. 10/23/13  Yes Erick ColaceAndrew E Kirsteins, MD  albuterol (PROVENTIL HFA;VENTOLIN HFA) 108 (90 BASE) MCG/ACT inhaler Inhale 2 puffs into the lungs every 6 (six) hours as needed. For wheezing   Yes Historical Provider, MD  ALPRAZolam Prudy Feeler(XANAX) 1 MG tablet Take 1 mg by mouth 2 (two) times daily.   Yes Historical Provider, MD  gabapentin (NEURONTIN) 400 MG capsule Take 1 capsule (400 mg total) by mouth 4 (four) times daily. 09/18/13  Yes Erick ColaceAndrew E Kirsteins, MD  ibuprofen (ADVIL,MOTRIN) 800 MG  tablet Take 800 mg by mouth every 8 (eight) hours as needed.   Yes Historical Provider, MD  lisinopril-hydrochlorothiazide (PRINZIDE,ZESTORETIC) 20-25 MG per tablet Take 1 tablet by mouth daily.   Yes Historical Provider, MD  omeprazole (PRILOSEC) 40 MG capsule Take 40 mg by mouth daily.   Yes Historical Provider, MD  predniSONE (DELTASONE) 10 MG tablet Take 6 tablets (60 mg total) by mouth daily. 07/31/13  Yes Lyanne CoKevin M Campos, MD  traMADol (ULTRAM) 50 MG tablet Take 1 tablet (50 mg total) by mouth 4 (four) times daily. 09/18/13  Yes Erick ColaceAndrew E Kirsteins, MD  valACYclovir (VALTREX) 1000 MG tablet Take 1,000 mg by mouth daily as needed.    Yes Historical Provider, MD   Physical Exam: Filed Vitals:   10/27/13 1332  BP: 190/112  Pulse: 103  Temp: 98.5 F (36.9 C)  Resp: 22    BP 190/112  Pulse 103  Temp(Src) 98.5 F (36.9 C) (Oral)  Resp 22  Ht 4' 11.5" (1.511 m)  Wt 73.846 kg (162 lb 12.8 oz)  BMI 32.34 kg/m2  SpO2 93%  General:  Appears calm and comfortable, speaking full sentences.  Eyes: PERRL, normal lids, irises & conjunctiva ENT: grossly normal hearing, lips & tongue Neck: no LAD, masses or thyromegaly Cardiovascular: RRR, no m/r/g. No LE edema. Respiratory: Mild tachypnea,  Normal respiratory effort, decrease breath sounds, bilateral expiratory wheezes.  Abdomen: soft, ntnd Skin: no rash or induration seen on limited exam  Musculoskeletal: grossly normal tone BUE/BLE Psychiatric: grossly normal mood and affect, speech fluent and appropriate Neurologic: grossly non-focal.          Labs on Admission:  Basic Metabolic Panel:  Recent Labs Lab 10/27/13 1009  NA 139  K 3.9  CL 99  CO2 22  GLUCOSE 89  BUN 13  CREATININE 0.64  CALCIUM 8.9   Liver Function Tests: No results found for this basename: AST, ALT, ALKPHOS, BILITOT, PROT, ALBUMIN,  in the last 168 hours No results found for this basename: LIPASE, AMYLASE,  in the last 168 hours No results found for this  basename: AMMONIA,  in the last 168 hours CBC:  Recent Labs Lab 10/27/13 1009  WBC 12.5*  HGB 12.9  HCT 37.5  MCV 82.8  PLT 384   Cardiac Enzymes: No results found for this basename: CKTOTAL, CKMB, CKMBINDEX, TROPONINI,  in the last 168 hours  BNP (last 3 results) No results found for this basename: PROBNP,  in the last 8760 hours CBG: No results found for this basename: GLUCAP,  in the last 168 hours  Radiological Exams on Admission: Dg Chest 2 View  10/27/2013   CLINICAL DATA:  Shortness of breath.  Possible asthma attack.  EXAM: CHEST  2 VIEW  COMPARISON:  Chest x-ray 07/31/2013.  FINDINGS: Lung volumes are normal. No consolidative airspace disease. No pleural effusions. No pneumothorax. No pulmonary nodule or mass noted. Pulmonary vasculature and the cardiomediastinal silhouette are within normal limits.  IMPRESSION: No radiographic evidence of acute cardiopulmonary disease.   Electronically Signed   By: Trudie Reedaniel  Entrikin M.D.   On: 10/27/2013 11:32    EKG: order EKG.   Assessment/Plan Active Problems:   Asthma exacerbation   Asthma  1-Asthma Exacerbation; Admit to regular floor. Schedule nebulizer treatments, IV solumedrol. She has mild leukocytosis will cover with Levaquin. Start guaifenesin for cough.   2-Tachycardia; Could be related to hypoxemia, vs to albuterol. Check UDS.   3-Hypertension; resume home BP medications.  4-Anxiety; resume xanax.  5-Leukocytosis; check UA. Chest x ray negative for PNA. Could be related to prednisone.   Code Status: full code.  Family Communication: Care discussed with patient.  Disposition Plan: expect 2 to 3 days inpatient.   Time spent: 75 minutes.   [name], Brittane Grudzinski A [facility] Pager [phone]  **Disclaimer: This note may have been dictated with voice recognition software. Similar sounding words can inadvertently be transcribed and this note may contain transcription errors which may not have been corrected upon  publication of note.**

## 2013-10-27 NOTE — ED Provider Notes (Signed)
CSN: [id number]     Arrival date & time 10/27/13  4098 History   First MD Initiated Contact with Patient 10/27/13 [id number]     Chief Complaint  Patient presents with  . Asthma     (Consider location/radiation/quality/duration/timing/severity/associated sxs/prior Treatment) Patient is a 48 y.o. female presenting with asthma. The history is provided by the patient.  Asthma This is a recurrent problem. The current episode started in the past 7 days. The problem occurs constantly. The problem has been gradually worsening. Associated symptoms include coughing. Pertinent negatives include no abdominal pain, chest pain, chills, congestion, fever, nausea or vomiting. The symptoms are aggravated by exertion. She has tried position changes, relaxation and rest for the symptoms. The treatment provided mild relief.   Pt is a 48yo female with hx of asthma and HTN presenting to ED with gradually worsening shortness of breath.  Reports her asthma started to flare up about 1 week ago, was started on prednisone and has been using her albuterol inhaler and nebulizer w/o relief.  States her ribs and throat are sore, 9/10 from coughing so much. Denies smoking or sick contacts.  Denies fever, n/v/d.  Denies recent travel.  Reports having to be hospitalized 1 year ago for her asthma.    Past Medical History  Diagnosis Date  . Asthma   . Hypertension    Past Surgical History  Procedure Laterality Date  . Cholecystectomy    . Tonsillectomy    . Tubal ligation    . Ankle surgery    . Tubal ligation    . Ankle surgery    . Replacement total knee bilateral Bilateral    History reviewed. No pertinent family history. History  Substance Use Topics  . Smoking status: Former Smoker -- 7 years    Types: Cigarettes  . Smokeless tobacco: Never Used  . Alcohol Use: No   OB History   Grav Para Term Preterm Abortions TAB SAB Ect Mult Living                 Review of Systems  Constitutional: Negative for fever and  chills.  HENT: Negative for congestion.   Respiratory: Positive for cough, chest tightness and shortness of breath.   Cardiovascular: Negative for chest pain, palpitations and leg swelling.  Gastrointestinal: Negative for nausea, vomiting, abdominal pain and diarrhea.  All other systems reviewed and are negative.     Allergies  Review of patient's allergies indicates no known allergies.  Home Medications   Prior to Admission medications   Medication Sig Start Date End Date Taking? Authorizing Provider  acetaminophen-codeine (TYLENOL #4) 300-60 MG per tablet Take 1 tablet by mouth every 6 (six) hours as needed for moderate pain. 10/23/13  Yes Erick Colace, MD  albuterol (PROVENTIL HFA;VENTOLIN HFA) 108 (90 BASE) MCG/ACT inhaler Inhale 2 puffs into the lungs every 6 (six) hours as needed. For wheezing   Yes Historical Provider, MD  ALPRAZolam Prudy Feeler) 1 MG tablet Take 1 mg by mouth 2 (two) times daily.   Yes Historical Provider, MD  gabapentin (NEURONTIN) 400 MG capsule Take 1 capsule (400 mg total) by mouth 4 (four) times daily. 09/18/13  Yes Erick Colace, MD  ibuprofen (ADVIL,MOTRIN) 800 MG tablet Take 800 mg by mouth every 8 (eight) hours as needed.   Yes Historical Provider, MD  lisinopril-hydrochlorothiazide (PRINZIDE,ZESTORETIC) 20-25 MG per tablet Take 1 tablet by mouth daily.   Yes Historical Provider, MD  omeprazole (PRILOSEC) 40 MG capsule Take 40 mg by  mouth daily.   Yes Historical Provider, MD  predniSONE (DELTASONE) 10 MG tablet Take 6 tablets (60 mg total) by mouth daily. 07/31/13  Yes Lyanne CoKevin M Campos, MD  traMADol (ULTRAM) 50 MG tablet Take 1 tablet (50 mg total) by mouth 4 (four) times daily. 09/18/13  Yes Erick ColaceAndrew E Kirsteins, MD  valACYclovir (VALTREX) 1000 MG tablet Take 1,000 mg by mouth daily as needed.    Yes Historical Provider, MD   BP 168/94  Pulse 101  Temp(Src) 99.3 F (37.4 C) (Oral)  Resp 20  SpO2 93% Physical Exam  Nursing note and vitals  reviewed. Constitutional: She appears well-developed and well-nourished.  Pt sitting in exam bed, holding emesis bag, appears winded.   HENT:  Head: Normocephalic and atraumatic.  Right Ear: Hearing, tympanic membrane, external ear and ear canal normal.  Left Ear: Hearing, tympanic membrane, external ear and ear canal normal.  Nose: Nose normal.  Mouth/Throat: Uvula is midline, oropharynx is clear and moist and mucous membranes are normal.  Eyes: Conjunctivae are normal. No scleral icterus.  Neck: Normal range of motion. Neck supple.  Cardiovascular: Normal rate, regular rhythm and normal heart sounds.   Regular rate and rhythm   Pulmonary/Chest: Effort normal and breath sounds normal. No respiratory distress. She has no wheezes. She has no rales. She exhibits no tenderness.  Audible inspiratory and expiratory wheeze with intermittent post-tussive gagging.  No accessory muscle use. Winded between speaking short sentences.  Abdominal: Soft. Bowel sounds are normal. She exhibits no distension and no mass. There is no tenderness. There is no rebound and no guarding.  Soft, non-distended, non-tender.  Musculoskeletal: Normal range of motion.  Neurological: She is alert.  Skin: Skin is warm and dry.    ED Course  Procedures (including critical care time) Labs Review Labs Reviewed  CBC - Abnormal; Notable for the following:    WBC 12.5 (*)    All other components within normal limits  BASIC METABOLIC PANEL    Imaging Review Dg Chest 2 View  10/27/2013   CLINICAL DATA:  Shortness of breath.  Possible asthma attack.  EXAM: CHEST  2 VIEW  COMPARISON:  Chest x-ray 07/31/2013.  FINDINGS: Lung volumes are normal. No consolidative airspace disease. No pleural effusions. No pneumothorax. No pulmonary nodule or mass noted. Pulmonary vasculature and the cardiomediastinal silhouette are within normal limits.  IMPRESSION: No radiographic evidence of acute cardiopulmonary disease.   Electronically  Signed   By: Trudie Reedaniel  Entrikin M.D.   On: 10/27/2013 11:32     EKG Interpretation None      MDM   Final diagnoses:  Asthma exacerbation    pt presenting to ED with asthma exacerbation x1 week. Pt has tried 1 week of prednisone with home albuterol inhaler and nebulize machine w/o relief.  Pt is hypertensive in triage however she has hx of HTN and states she has not taken her lisinopril today.    Pt given 1hr long albuterol/atrovent nebulizer tx.  Pt states she feels better. Wheezing has improved significantly, mild bibasilar expiratory wheeze present.   CXR: unremarkable.  Pt ambulated prior to discharge, began to have audible expiratory and inspiratory wheezing again, O2 dropped to 92%, HR increased to 109.  Discussed pt with Dr. Freida BusmanAllen who recommended pt be admitted as she has failed outpatient tx of asthma exacerbation as well as a 1hr long duoneb tx in ED.  Discussed admission with pt who is agreeable with plan. Will consult hospitalist.    12:19 PM Consulted  with Dr. Sunnie Nielsenegalado, pt will be admitted to med-surg bed with albuterol/atrovent Q4 hours.        Junius Finnerrin O'Malley, PA-C 10/27/13 1256

## 2013-10-27 NOTE — ED Notes (Signed)
Pt ambulated to and from x-ray without assistance

## 2013-10-27 NOTE — ED Notes (Signed)
Initial Contact - pt resting on stretcher with family at bedside, pt reports feeling mildly better after updraft, continues to c/o SOB and pain "in my ribs".  Ls with exp wheeze noted.  Pt denies other complaints.  Given PO challenge with verbal request EDPA.  Tolerating well.  Skin PWD.  A+OX4.  NAD.

## 2013-10-27 NOTE — ED Notes (Signed)
Pt given ginger ale.

## 2013-10-27 NOTE — ED Notes (Signed)
Attempted to collect blood from pt when pt was stuck pt asked for needle to come ou and be restuck RN made aware

## 2013-10-27 NOTE — ED Notes (Signed)
Per pt, asthma symptoms for over a week, no relief with inhalers, nebs and prednisone

## 2013-10-27 NOTE — ED Notes (Signed)
Pt reports feeling better after updraft, exp wheeze noted with auscultation.  Denies SOB.

## 2013-10-27 NOTE — ED Notes (Signed)
Respiratory notified about Adult wheeze protocol.

## 2013-10-27 NOTE — ED Notes (Signed)
Pt ambulated with steady gait, o2 sat 95%RA.  However pt became more SOB with activity and inc in wheezing noted.  EDPA aware.  Awaiting orders.

## 2013-10-27 NOTE — ED Notes (Signed)
Provided pt with Turkey Sandwich 

## 2013-10-28 DIAGNOSIS — I10 Essential (primary) hypertension: Secondary | ICD-10-CM

## 2013-10-28 LAB — CBC
HCT: 39.8 % (ref 36.0–46.0)
Hemoglobin: 13.3 g/dL (ref 12.0–15.0)
MCH: 28.3 pg (ref 26.0–34.0)
MCHC: 33.4 g/dL (ref 30.0–36.0)
MCV: 84.7 fL (ref 78.0–100.0)
PLATELETS: 348 10*3/uL (ref 150–400)
RBC: 4.7 MIL/uL (ref 3.87–5.11)
RDW: 14.7 % (ref 11.5–15.5)
WBC: 17.7 10*3/uL — AB (ref 4.0–10.5)

## 2013-10-28 LAB — GLUCOSE, CAPILLARY
GLUCOSE-CAPILLARY: 137 mg/dL — AB (ref 70–99)
Glucose-Capillary: 194 mg/dL — ABNORMAL HIGH (ref 70–99)
Glucose-Capillary: 188 mg/dL — ABNORMAL HIGH (ref 70–99)

## 2013-10-28 LAB — BASIC METABOLIC PANEL WITH GFR
BUN: 15 mg/dL (ref 6–23)
CO2: 22 meq/L (ref 19–32)
Calcium: 9.8 mg/dL (ref 8.4–10.5)
Chloride: 94 meq/L — ABNORMAL LOW (ref 96–112)
Creatinine, Ser: 0.55 mg/dL (ref 0.50–1.10)
GFR calc Af Amer: >90 mL/min
GFR calc non Af Amer: >90 mL/min
Glucose, Bld: 202 mg/dL — ABNORMAL HIGH (ref 70–99)
Potassium: 3.6 meq/L — ABNORMAL LOW (ref 3.7–5.3)
Sodium: 133 meq/L — ABNORMAL LOW (ref 137–147)

## 2013-10-28 MED ORDER — ALBUTEROL SULFATE (2.5 MG/3ML) 0.083% IN NEBU
2.5000 mg | INHALATION_SOLUTION | Freq: Four times a day (QID) | RESPIRATORY_TRACT | Status: DC
  Administered 2013-10-29 (×2): 2.5 mg via RESPIRATORY_TRACT
  Filled 2013-10-28 (×2): qty 3

## 2013-10-28 MED ORDER — POTASSIUM CHLORIDE CRYS ER 20 MEQ PO TBCR
40.0000 meq | EXTENDED_RELEASE_TABLET | Freq: Once | ORAL | Status: AC
  Administered 2013-10-28: 40 meq via ORAL
  Filled 2013-10-28: qty 2

## 2013-10-28 MED ORDER — SENNOSIDES-DOCUSATE SODIUM 8.6-50 MG PO TABS
1.0000 | ORAL_TABLET | Freq: Two times a day (BID) | ORAL | Status: DC | PRN
  Administered 2013-10-28: 1 via ORAL
  Filled 2013-10-28: qty 1

## 2013-10-28 MED ORDER — HYDRALAZINE HCL 20 MG/ML IJ SOLN
10.0000 mg | Freq: Three times a day (TID) | INTRAMUSCULAR | Status: DC | PRN
  Administered 2013-10-28: 10 mg via INTRAVENOUS
  Filled 2013-10-28: qty 0.5

## 2013-10-28 MED ORDER — ALBUTEROL SULFATE (2.5 MG/3ML) 0.083% IN NEBU: 2.5000 mg | INHALATION_SOLUTION | RESPIRATORY_TRACT | Status: DC | PRN

## 2013-10-28 MED ORDER — ZOLPIDEM TARTRATE 5 MG PO TABS
5.0000 mg | ORAL_TABLET | Freq: Every evening | ORAL | Status: DC | PRN
  Administered 2013-10-29 (×2): 5 mg via ORAL
  Filled 2013-10-28 (×2): qty 1

## 2013-10-28 MED ORDER — POLYETHYLENE GLYCOL 3350 17 G PO PACK: 17.0000 g | PACK | Freq: Every day | ORAL | Status: DC

## 2013-10-28 MED ORDER — INSULIN ASPART 100 UNIT/ML ~~LOC~~ SOLN
0.0000 [IU] | Freq: Three times a day (TID) | SUBCUTANEOUS | Status: DC
  Administered 2013-10-28: 1 [IU] via SUBCUTANEOUS
  Administered 2013-10-28 – 2013-10-29 (×2): 2 [IU] via SUBCUTANEOUS
  Administered 2013-10-29: 3 [IU] via SUBCUTANEOUS
  Administered 2013-10-29: 1 [IU] via SUBCUTANEOUS
  Administered 2013-10-30: 3 [IU] via SUBCUTANEOUS
  Administered 2013-10-30: 2 [IU] via SUBCUTANEOUS

## 2013-10-28 NOTE — ED Provider Notes (Signed)
Medical screening examination/treatment/procedure(s) were performed by non-physician practitioner and as supervising physician I was immediately available for consultation/collaboration.  Krystel Fletchall T Denesia Donelan, MD 10/28/13 1511 

## 2013-10-28 NOTE — Progress Notes (Signed)
[facility] PROGRESS NOTE  Angela Hoffman ZOX:[id number] DOB: 09/06/1965 DOA: 10/27/2013 PCP: Angela Hoffman  Assessment/Plan: 1-Asthma Exacerbation; Continue with Schedule nebulizer treatments, IV solumedrol, Levaquin, guaifenesin for cough.  Feeling better, lung exam with better air movement, bilateral wheezing.   2-Tachycardia; Could be related to hypoxemia, vs to albuterol. Check EKG.   3-Hypertension; Continue with lisinopril, hydrochlorothiazide. PRN Hydralazine.  4-Anxiety; resume xanax.  5-Leukocytosis;  UA negative. Chest x ray negative for PNA. Could be related to prednisone.  6-Headache, mild. Monitor. Neuro exam non focal. No vision changes.   Code Status: Full Code.  Family Communication: Care discussed with patient.  Disposition Plan: Remain inpatient.    Consultants:  none  Procedures:  none  Antibiotics:  Levaquin 6-12.   HPI/Subjective: Breathing better. Was not able to sleep overnight. Complaining of headaches.   Objective: Filed Vitals:   10/28/13 0530  BP: 175/92  Pulse: 101  Temp: 98 F (36.7 C)  Resp: 20   No intake or output data in the 24 hours ending 10/28/13 0828 Filed Weights   10/27/13 1332  Weight: 73.846 kg (162 lb 12.8 oz)    Exam:   General:  No distress.   Cardiovascular: S 1, S 2 RRR  Respiratory: Bilateral wheezing, more air movement.   Abdomen: BS present, soft, NT  Musculoskeletal: Trace edema.   Data Reviewed: Basic Metabolic Panel:  Recent Labs Lab 10/27/13 1009 10/28/13 0550  NA 139 133*  K 3.9 3.6*  CL 99 94*  CO2 22 22  GLUCOSE 89 202*  BUN 13 15  CREATININE 0.64 0.55  CALCIUM 8.9 9.8   Liver Function Tests: No results found for this basename: AST, ALT, ALKPHOS, BILITOT, PROT, ALBUMIN,  in the last 168 hours No results found for this basename: LIPASE, AMYLASE,  in the last 168 hours No results found for this basename: AMMONIA,  in the last 168 hours CBC:  Recent Labs Lab  10/27/13 1009 10/28/13 0550  WBC 12.5* 17.7*  HGB 12.9 13.3  HCT 37.5 39.8  MCV 82.8 84.7  PLT 384 348   Cardiac Enzymes: No results found for this basename: CKTOTAL, CKMB, CKMBINDEX, TROPONINI,  in the last 168 hours BNP (last 3 results) No results found for this basename: PROBNP,  in the last 8760 hours CBG: No results found for this basename: GLUCAP,  in the last 168 hours  No results found for this or any previous visit (from the past 240 hour(s)).   Studies: Dg Chest 2 View  10/27/2013   CLINICAL DATA:  Shortness of breath.  Possible asthma attack.  EXAM: CHEST  2 VIEW  COMPARISON:  Chest x-ray 07/31/2013.  FINDINGS: Lung volumes are normal. No consolidative airspace disease. No pleural effusions. No pneumothorax. No pulmonary nodule or mass noted. Pulmonary vasculature and the cardiomediastinal silhouette are within normal limits.  IMPRESSION: No radiographic evidence of acute cardiopulmonary disease.   Electronically Signed   By: Angela Hoffman  Angela M.D.   On: 10/27/2013 11:32    Scheduled Meds: . albuterol  2.5 mg Nebulization Q4H  . ALPRAZolam  1 mg Oral BID  . enoxaparin (LOVENOX) injection  40 mg Subcutaneous Q24H  . gabapentin  400 mg Oral QID  . guaiFENesin  600 mg Oral BID  . lisinopril  20 mg Oral Daily   And  . hydrochlorothiazide  25 mg Oral Daily  . levofloxacin  750 mg Oral Daily  . methylPREDNISolone (SOLU-MEDROL) injection  60 mg Intravenous Q6H  . pantoprazole  40  mg Oral Daily  . polyethylene glycol  17 g Oral Daily  . sodium chloride  3 mL Intravenous Q12H  . traMADol  50 mg Oral QID   Continuous Infusions:   Active Problems:   Asthma exacerbation   Asthma   Essential hypertension, benign    Time spent: 35 minutes.     Angela Hoffman  [facility] Pager [phone]. If 7PM-7AM, please contact night-coverage at [email or web address], password [email or web address] 10/28/2013, 8:28 AM  LOS: 1 day

## 2013-10-29 ENCOUNTER — Inpatient Hospital Stay (HOSPITAL_COMMUNITY): Payer: Medicaid Other

## 2013-10-29 LAB — BASIC METABOLIC PANEL WITH GFR
BUN: 14 mg/dL (ref 6–23)
CO2: 24 meq/L (ref 19–32)
Calcium: 9.6 mg/dL (ref 8.4–10.5)
Chloride: 96 meq/L (ref 96–112)
Creatinine, Ser: 0.55 mg/dL (ref 0.50–1.10)
GFR calc Af Amer: >90 mL/min
GFR calc non Af Amer: >90 mL/min
Glucose, Bld: 251 mg/dL — ABNORMAL HIGH (ref 70–99)
Potassium: 4 meq/L (ref 3.7–5.3)
Sodium: 135 meq/L — ABNORMAL LOW (ref 137–147)

## 2013-10-29 LAB — GLUCOSE, CAPILLARY
Glucose-Capillary: 209 mg/dL — ABNORMAL HIGH (ref 70–99)
Glucose-Capillary: 136 mg/dL — ABNORMAL HIGH (ref 70–99)
Glucose-Capillary: 198 mg/dL — ABNORMAL HIGH (ref 70–99)

## 2013-10-29 MED ORDER — MAGNESIUM SULFATE 40 MG/ML IJ SOLN
2.0000 g | Freq: Once | INTRAMUSCULAR | Status: AC
  Administered 2013-10-29: 2 g via INTRAVENOUS
  Filled 2013-10-29: qty 50

## 2013-10-29 MED ORDER — FLUTICASONE PROPIONATE 50 MCG/ACT NA SUSP
1.0000 | Freq: Every day | NASAL | Status: DC
  Administered 2013-10-29 – 2013-10-30 (×2): 1 via NASAL
  Filled 2013-10-29: qty 16

## 2013-10-29 MED ORDER — PANTOPRAZOLE SODIUM 40 MG PO TBEC
40.0000 mg | DELAYED_RELEASE_TABLET | Freq: Two times a day (BID) | ORAL | Status: DC
  Administered 2013-10-29 – 2013-10-30 (×2): 40 mg via ORAL
  Filled 2013-10-29 (×3): qty 1

## 2013-10-29 MED ORDER — IPRATROPIUM-ALBUTEROL 0.5-2.5 (3) MG/3ML IN SOLN
3.0000 mL | RESPIRATORY_TRACT | Status: DC
  Administered 2013-10-29 – 2013-10-30 (×5): 3 mL via RESPIRATORY_TRACT
  Filled 2013-10-29 (×6): qty 3

## 2013-10-29 NOTE — Progress Notes (Signed)
[facility] PROGRESS NOTE  Angela LaniusMelinda Hoffman ZHY:[id number] DOB: 05/23/1965 DOA: 10/27/2013 PCP: Angela Hoffman, APRIL, FNP  Assessment/Plan: 1-Asthma Exacerbation; Continue with Schedule nebulizer treatments, IV solumedrol, Levaquin, guaifenesin for cough.  Feels better but still with SOB, not at baseline.  I will change nebulizer to Q 4 Hours, will add ipratropium. Will give one dose Mg IV.   2-Tachycardia; Could be related to hypoxemia, vs to albuterol.   3-Hypertension; Continue with lisinopril, hydrochlorothiazide. PRN Hydralazine.  4-Anxiety; resume xanax.  5-Leukocytosis;  UA negative. Chest x ray negative for PNA. Could be related to prednisone.  6-Headache, CT head show pansinusitis. Will order flonase. Patient on antibiotics.   Code Status: Full Code.  Family Communication: Care discussed with patient.  Disposition Plan: Remain inpatient.    Consultants:  none  Procedures:  none  Antibiotics:  Levaquin 6-12.   HPI/Subjective: She is breathing better than when she came but still having difficulty.  Still with headaches.   Objective: Filed Vitals:   10/29/13 0527  BP: 130/80  Pulse: 104  Temp: 98 F (36.7 C)  Resp: 18    Intake/Output Summary (Last 24 hours) at 10/29/13 1330 Last data filed at 10/29/13 0700  Gross per 24 hour  Intake    480 ml  Output      0 ml  Net    480 ml   Filed Weights   10/27/13 1332 10/29/13 0527  Weight: 73.846 kg (162 lb 12.8 oz) 75.932 kg (167 lb 6.4 oz)    Exam:   General:  No distress.   Cardiovascular: S 1, S 2 RRR  Respiratory: Bilateral wheezing, more air movement.   Abdomen: BS present, soft, NT  Musculoskeletal: Trace edema.   Data Reviewed: Basic Metabolic Panel:  Recent Labs Lab 10/27/13 1009 10/28/13 0550 10/29/13 0615  NA 139 133* 135*  K 3.9 3.6* 4.0  CL 99 94* 96  CO2 22 22 24   GLUCOSE 89 202* 251*  BUN 13 15 14   CREATININE 0.64 0.55 0.55  CALCIUM 8.9 9.8 9.6   Liver Function Tests: No  results found for this basename: AST, ALT, ALKPHOS, BILITOT, PROT, ALBUMIN,  in the last 168 hours No results found for this basename: LIPASE, AMYLASE,  in the last 168 hours No results found for this basename: AMMONIA,  in the last 168 hours CBC:  Recent Labs Lab 10/27/13 1009 10/28/13 0550  WBC 12.5* 17.7*  HGB 12.9 13.3  HCT 37.5 39.8  MCV 82.8 84.7  PLT 384 348   Cardiac Enzymes: No results found for this basename: CKTOTAL, CKMB, CKMBINDEX, TROPONINI,  in the last 168 hours BNP (last 3 results) No results found for this basename: PROBNP,  in the last 8760 hours CBG:  Recent Labs Lab 10/28/13 1121 10/28/13 1626 10/28/13 2138 10/29/13 0735 10/29/13 1118  GLUCAP 194* 137* 188* 209* 136*    No results found for this or any previous visit (from the past 240 hour(s)).   Studies: Ct Head Wo Contrast  10/29/2013   CLINICAL DATA:  Headache.  EXAM: CT HEAD WITHOUT CONTRAST  TECHNIQUE: Contiguous axial images were obtained from the base of the skull through the vertex without intravenous contrast.  COMPARISON:  None.  FINDINGS: No mass. No hydrocephalus. No hemorrhage. No acute bony abnormality identified. Extensive mucosal thickening throughout the frontal, ethmoid, maxillary, and sphenoid sinuses. This is consistent with pansinusitis. Mastoids are clear.  IMPRESSION: Severe pansinusitis.   Electronically Signed   By: Maisie Fushomas  Register   On: 10/29/2013 12:39  Scheduled Meds: . ALPRAZolam  1 mg Oral BID  . enoxaparin (LOVENOX) injection  40 mg Subcutaneous Q24H  . fluticasone  1 spray Each Nare Daily  . gabapentin  400 mg Oral QID  . guaiFENesin  600 mg Oral BID  . lisinopril  20 mg Oral Daily   And  . hydrochlorothiazide  25 mg Oral Daily  . insulin aspart  0-9 Units Subcutaneous TID WC  . ipratropium-albuterol  3 mL Nebulization Q4H  . levofloxacin  750 mg Oral Daily  . magnesium sulfate 1 - 4 g bolus IVPB  2 g Intravenous Once  . methylPREDNISolone (SOLU-MEDROL)  injection  60 mg Intravenous Q6H  . pantoprazole  40 mg Oral BID  . polyethylene glycol  17 g Oral Daily  . sodium chloride  3 mL Intravenous Q12H  . traMADol  50 mg Oral QID   Continuous Infusions:   Active Problems:   Asthma exacerbation   Asthma   Essential hypertension, benign    Time spent: 30 minutes.     [name], Angela Hoffman A  [facility] Pager [phone]. If 7PM-7AM, please contact night-coverage at [email or web address], password [email or web address] 10/29/2013, 1:30 PM  LOS: 2 days

## 2013-10-30 ENCOUNTER — Ambulatory Visit (HOSPITAL_COMMUNITY)
Admit: 2013-10-30 | Discharge: 2013-10-30 | Disposition: A | Discharge status: Home / Self Care | Payer: Medicaid Other | Attending: Physical Medicine & Rehabilitation | Admitting: Physical Medicine & Rehabilitation

## 2013-10-30 DIAGNOSIS — M545 Low back pain, unspecified: Secondary | ICD-10-CM | POA: Diagnosis not present

## 2013-10-30 DIAGNOSIS — G8929 Other chronic pain: Secondary | ICD-10-CM

## 2013-10-30 DIAGNOSIS — M549 Dorsalgia, unspecified: Secondary | ICD-10-CM

## 2013-10-30 MED ORDER — GUAIFENESIN ER 600 MG PO TB12: 600.0000 mg | ORAL_TABLET | Freq: Two times a day (BID) | ORAL | Status: DC

## 2013-10-30 MED ORDER — PREDNISONE 20 MG PO TABS: ORAL_TABLET | ORAL | Status: DC

## 2013-10-30 MED ORDER — IPRATROPIUM BROMIDE HFA 17 MCG/ACT IN AERS: 2.0000 | INHALATION_SPRAY | Freq: Four times a day (QID) | RESPIRATORY_TRACT | Status: DC | PRN

## 2013-10-30 MED ORDER — ZOLPIDEM TARTRATE 5 MG PO TABS: 5.0000 mg | ORAL_TABLET | Freq: Every evening | ORAL | Status: DC | PRN

## 2013-10-30 MED ORDER — IPRATROPIUM-ALBUTEROL 0.5-2.5 (3) MG/3ML IN SOLN
3.0000 mL | Freq: Four times a day (QID) | RESPIRATORY_TRACT | Status: DC
  Administered 2013-10-30: 3 mL via RESPIRATORY_TRACT
  Filled 2013-10-30: qty 3

## 2013-10-30 MED ORDER — PREDNISONE 50 MG PO TABS
60.0000 mg | ORAL_TABLET | Freq: Every day | ORAL | Status: DC
  Filled 2013-10-30: qty 1

## 2013-10-30 MED ORDER — ALBUTEROL SULFATE (2.5 MG/3ML) 0.083% IN NEBU: 2.5000 mg | INHALATION_SOLUTION | RESPIRATORY_TRACT | Status: DC | PRN

## 2013-10-30 MED ORDER — LEVOFLOXACIN 750 MG PO TABS: 750.0000 mg | ORAL_TABLET | Freq: Every day | ORAL | Status: DC

## 2013-10-30 MED ORDER — FLUTICASONE PROPIONATE 50 MCG/ACT NA SUSP: 1.0000 | Freq: Every day | NASAL | Status: DC

## 2013-10-30 NOTE — Discharge Summary (Addendum)
Physician Discharge Summary  Angela LaniusMelinda Veltri ZOX:[id number] DOB: 06/01/1965 DOA: 10/27/2013  PCP: Tilda BurrowGANT, APRIL, FNP  Admit date: 10/27/2013 Discharge date: 10/30/2013  Time spent: 35 minutes  Recommendations for Outpatient Follow-up:  1. Needs referral to pulmonologist.  2. Needs evaluation for hyperglycemia when off prednisone.  3. Needs CBC to follow up WBC.  4. Needs further adjustment of BP medications.   Discharge Diagnoses:    Asthma exacerbation   Essential hypertension, benign   Discharge Condition: Improved.   Diet recommendation: Regular diet.   Filed Weights   10/27/13 1332 10/29/13 0527 10/30/13 0513  Weight: 73.846 kg (162 lb 12.8 oz) 75.932 kg (167 lb 6.4 oz) 75.479 kg (166 lb 6.4 oz)    History of present illness:  Angela Hoffman is a 48 y.o. female who presents complaining of shortness of breath, tightness for last 7 days, getting worse. She has been using her inhale without relieved. She relates productive cough, yellow phlegm, no fevers. She is complaining of ribs pain after coughing. She denies abdominal pain, diarrhea. She is feeling better, after multiples nebulizer treatments.    Hospital Course:  1-Asthma Exacerbation; Patient was treated  Schedule nebulizer treatments, IV solumedrol, Levaquin, guaifenesin for cough. She improved slowly. Iprotropium was added to her nebulizer regimen due to not improvement in symptoms. She also received a dose of IV magnesium. She is feeling better, back to baseline, no wheezing in lung exam. She has sinusitis this is probably not helping her asthma. She will be discharge on prednisone taper and antibiotics.   2-Tachycardia; Could be related to hypoxemia, vs to albuterol. Resolved.   3-Hypertension; Continue with lisinopril, hydrochlorothiazide. Suspect BP will decrease after discontinuing IV steroids.   4-Anxiety; resume xanax. She ask for Ambien for bedtime. I gave her 3 days, advised her not to take it with Xanax.   5-Leukocytosis; UA negative. Chest x ray negative for PNA. Could be related to prednisone.  6-Headache, CT head show pansinusitis. Will order flonase. Patient on antibiotics. 7-Hyperglycemia; likely related to steroids. Need repeat CBG and HBA1 c when off steroids.   Procedures: none Consultations:  none  Discharge Exam: Filed Vitals:   10/30/13 0513  BP: 163/101  Pulse: 95  Temp: 98 F (36.7 C)  Resp: 18    General: No distress.  Cardiovascular: S 1, S 2 RRR Respiratory: CTA  Discharge Instructions You were cared for by a hospitalist during your hospital stay. If you have any questions about your discharge medications or the care you received while you were in the hospital after you are discharged, you can call the unit and asked to speak with the hospitalist on call if the hospitalist that took care of you is not available. Once you are discharged, your primary care physician will handle any further medical issues. Please note that NO REFILLS for any discharge medications will be authorized once you are discharged, as it is imperative that you return to your primary care physician (or establish a relationship with a primary care physician if you do not have one) for your aftercare needs so that they can reassess your need for medications and monitor your lab values.  Discharge Instructions   Diet Carb Modified    Complete by:  As directed      Increase activity slowly    Complete by:  As directed             Medication List         acetaminophen-codeine 300-60 MG per tablet  Commonly known as:  TYLENOL #4  Take 1 tablet by mouth every 6 (six) hours as needed for moderate pain.     albuterol 108 (90 BASE) MCG/ACT inhaler  Commonly known as:  PROVENTIL HFA;VENTOLIN HFA  Inhale 2 puffs into the lungs every 6 (six) hours as needed. For wheezing     ALPRAZolam 1 MG tablet  Commonly known as:  XANAX  Take 1 mg by mouth 2 (two) times daily.     fluticasone 50 MCG/ACT  nasal spray  Commonly known as:  FLONASE  Place 1 spray into both nostrils daily.     gabapentin 400 MG capsule  Commonly known as:  NEURONTIN  Take 1 capsule (400 mg total) by mouth 4 (four) times daily.     guaiFENesin 600 MG 12 hr tablet  Commonly known as:  MUCINEX  Take 1 tablet (600 mg total) by mouth 2 (two) times daily.     ibuprofen 800 MG tablet  Commonly known as:  ADVIL,MOTRIN  Take 800 mg by mouth every 8 (eight) hours as needed.     ipratropium 17 MCG/ACT inhaler  Commonly known as:  ATROVENT HFA  Inhale 2 puffs into the lungs every 6 (six) hours as needed for wheezing.     levofloxacin 750 MG tablet  Commonly known as:  LEVAQUIN  Take 1 tablet (750 mg total) by mouth daily.     lisinopril-hydrochlorothiazide 20-25 MG per tablet  Commonly known as:  PRINZIDE,ZESTORETIC  Take 1 tablet by mouth daily.     omeprazole 40 MG capsule  Commonly known as:  PRILOSEC  Take 40 mg by mouth daily.     predniSONE 20 MG tablet  Commonly known as:  DELTASONE  Take 3 tablets for 3 days then 2 tablet for 3 days the 1 tablet for 3 days then stop.     traMADol 50 MG tablet  Commonly known as:  ULTRAM  Take 1 tablet (50 mg total) by mouth 4 (four) times daily.     valACYclovir 1000 MG tablet  Commonly known as:  VALTREX  Take 1,000 mg by mouth daily as needed.     zolpidem 5 MG tablet  Commonly known as:  AMBIEN  Take 1 tablet (5 mg total) by mouth at bedtime as needed for sleep.       No Known Allergies     Follow-up Information   Follow up with GANT, APRIL, FNP. (Follow up with your primary care provider for ongoing healthcare needs and recheck of symptoms if not improving)    Specialty:  Nurse Practitioner   Contact information:   [address] [place] [address] [phone]        The results of significant diagnostics from this hospitalization (including imaging, microbiology, ancillary and laboratory) are listed below for reference.     Significant Diagnostic Studies: Dg Chest 2 View  10/27/2013   CLINICAL DATA:  Shortness of breath.  Possible asthma attack.  EXAM: CHEST  2 VIEW  COMPARISON:  Chest x-ray 07/31/2013.  FINDINGS: Lung volumes are normal. No consolidative airspace disease. No pleural effusions. No pneumothorax. No pulmonary nodule or mass noted. Pulmonary vasculature and the cardiomediastinal silhouette are within normal limits.  IMPRESSION: No radiographic evidence of acute cardiopulmonary disease.   Electronically Signed   By: Trudie Reedaniel  Entrikin M.D.   On: 10/27/2013 11:32   Ct Head Wo Contrast  10/29/2013   CLINICAL DATA:  Headache.  EXAM: CT HEAD WITHOUT CONTRAST  TECHNIQUE: Contiguous axial images  were obtained from the base of the skull through the vertex without intravenous contrast.  COMPARISON:  None.  FINDINGS: No mass. No hydrocephalus. No hemorrhage. No acute bony abnormality identified. Extensive mucosal thickening throughout the frontal, ethmoid, maxillary, and sphenoid sinuses. This is consistent with pansinusitis. Mastoids are clear.  IMPRESSION: Severe pansinusitis.   Electronically Signed   By: Maisie Fus  Register   On: 10/29/2013 12:39    Microbiology: No results found for this or any previous visit (from the past 240 hour(s)).   Labs: Basic Metabolic Panel:  Recent Labs Lab 10/27/13 1009 10/28/13 0550 10/29/13 0615  NA 139 133* 135*  K 3.9 3.6* 4.0  CL 99 94* 96  CO2 22 22 24   GLUCOSE 89 202* 251*  BUN 13 15 14   CREATININE 0.64 0.55 0.55  CALCIUM 8.9 9.8 9.6   Liver Function Tests: No results found for this basename: AST, ALT, ALKPHOS, BILITOT, PROT, ALBUMIN,  in the last 168 hours No results found for this basename: LIPASE, AMYLASE,  in the last 168 hours No results found for this basename: AMMONIA,  in the last 168 hours CBC:  Recent Labs Lab 10/27/13 1009 10/28/13 0550  WBC 12.5* 17.7*  HGB 12.9 13.3  HCT 37.5 39.8  MCV 82.8 84.7  PLT 384 348   Cardiac Enzymes: No  results found for this basename: CKTOTAL, CKMB, CKMBINDEX, TROPONINI,  in the last 168 hours BNP: BNP (last 3 results) No results found for this basename: PROBNP,  in the last 8760 hours CBG:  Recent Labs Lab 10/28/13 1626 10/28/13 2138 10/29/13 0735 10/29/13 1118 10/29/13 1631  GLUCAP 137* 188* 209* 136* 198*       Signed:  Nea Gittens A  [facility] 10/30/2013, 12:31 PM

## 2013-10-30 NOTE — Progress Notes (Signed)
Patient discharge home with boyfriend, alert and oriented, discharge instructions given, patient verbalize understanding of discharge instructions given, My Chart access declined at this time, patient in stable condition at this time

## 2013-10-31 LAB — GLUCOSE, CAPILLARY
Glucose-Capillary: 251 mg/dL — ABNORMAL HIGH (ref 70–99)
Glucose-Capillary: 160 mg/dL — ABNORMAL HIGH (ref 70–99)
Glucose-Capillary: 240 mg/dL — ABNORMAL HIGH (ref 70–99)

## 2013-12-05 ENCOUNTER — Ambulatory Visit (HOSPITAL_BASED_OUTPATIENT_CLINIC_OR_DEPARTMENT_OTHER): Payer: Medicaid Other | Admitting: Physical Medicine & Rehabilitation

## 2013-12-05 ENCOUNTER — Encounter: Payer: Medicaid Other | Attending: Physical Medicine & Rehabilitation

## 2013-12-05 ENCOUNTER — Encounter: Payer: Self-pay | Admitting: Physical Medicine & Rehabilitation

## 2013-12-05 VITALS — BP 137/87 | HR 100 | Resp 14 | Ht 59.0 in | Wt 160.0 lb

## 2013-12-05 DIAGNOSIS — M25561 Pain in right knee: Secondary | ICD-10-CM

## 2013-12-05 DIAGNOSIS — M549 Dorsalgia, unspecified: Secondary | ICD-10-CM

## 2013-12-05 DIAGNOSIS — I10 Essential (primary) hypertension: Secondary | ICD-10-CM | POA: Insufficient documentation

## 2013-12-05 DIAGNOSIS — Z96659 Presence of unspecified artificial knee joint: Secondary | ICD-10-CM | POA: Diagnosis not present

## 2013-12-05 DIAGNOSIS — M1710 Unilateral primary osteoarthritis, unspecified knee: Secondary | ICD-10-CM | POA: Insufficient documentation

## 2013-12-05 DIAGNOSIS — G8929 Other chronic pain: Secondary | ICD-10-CM

## 2013-12-05 DIAGNOSIS — Z79899 Other long term (current) drug therapy: Secondary | ICD-10-CM | POA: Diagnosis not present

## 2013-12-05 DIAGNOSIS — M25569 Pain in unspecified knee: Secondary | ICD-10-CM | POA: Diagnosis present

## 2013-12-05 DIAGNOSIS — M24561 Contracture, right knee: Secondary | ICD-10-CM

## 2013-12-05 DIAGNOSIS — G8928 Other chronic postprocedural pain: Secondary | ICD-10-CM

## 2013-12-05 DIAGNOSIS — M25562 Pain in left knee: Principal | ICD-10-CM

## 2013-12-05 DIAGNOSIS — M24569 Contracture, unspecified knee: Secondary | ICD-10-CM

## 2013-12-05 DIAGNOSIS — M24562 Contracture, left knee: Secondary | ICD-10-CM

## 2013-12-05 MED ORDER — CYCLOBENZAPRINE HCL 10 MG PO TABS: 10.0000 mg | ORAL_TABLET | Freq: Every day | ORAL | Status: DC

## 2013-12-05 MED ORDER — ACETAMINOPHEN-CODEINE #4 300-60 MG PO TABS: 1.0000 | ORAL_TABLET | Freq: Four times a day (QID) | ORAL | Status: DC | PRN

## 2013-12-05 NOTE — Patient Instructions (Signed)
Back Exercises These exercises may help you when beginning to rehabilitate your injury. Your symptoms may resolve with or without further involvement from your physician, physical therapist or athletic trainer. While completing these exercises, remember:   Restoring tissue flexibility helps normal motion to return to the joints. This allows healthier, less painful movement and activity.  An effective stretch should be held for at least 30 seconds.  A stretch should never be painful. You should only feel a gentle lengthening or release in the stretched tissue. STRETCH - Extension, Prone on Elbows   Lie on your stomach on the floor, a bed will be too soft. Place your palms about shoulder width apart and at the height of your head.  Place your elbows under your shoulders. If this is too painful, stack pillows under your chest.  Allow your body to relax so that your hips drop lower and make contact more completely with the floor.  Hold this position for __________ seconds.  Slowly return to lying flat on the floor. Repeat __________ times. Complete this exercise __________ times per day.  RANGE OF MOTION - Extension, Prone Press Ups   Lie on your stomach on the floor, a bed will be too soft. Place your palms about shoulder width apart and at the height of your head.  Keeping your back as relaxed as possible, slowly straighten your elbows while keeping your hips on the floor. You may adjust the placement of your hands to maximize your comfort. As you gain motion, your hands will come more underneath your shoulders.  Hold this position __________ seconds.  Slowly return to lying flat on the floor. Repeat __________ times. Complete this exercise __________ times per day.  RANGE OF MOTION- Quadruped, Neutral Spine   Assume a hands and knees position on a firm surface. Keep your hands under your shoulders and your knees under your hips. You may place padding under your knees for  comfort.  Drop your head and point your tail bone toward the ground below you. This will round out your low back like an angry cat. Hold this position for __________ seconds.  Slowly lift your head and release your tail bone so that your back sags into a large arch, like an old horse.  Hold this position for __________ seconds.  Repeat this until you feel limber in your low back.  Now, find your "sweet spot." This will be the most comfortable position somewhere between the two previous positions. This is your neutral spine. Once you have found this position, tense your stomach muscles to support your low back.  Hold this position for __________ seconds. Repeat __________ times. Complete this exercise __________ times per day.  STRETCH - Flexion, Single Knee to Chest   Lie on a firm bed or floor with both legs extended in front of you.  Keeping one leg in contact with the floor, bring your opposite knee to your chest. Hold your leg in place by either grabbing behind your thigh or at your knee.  Pull until you feel a gentle stretch in your low back. Hold __________ seconds.  Slowly release your grasp and repeat the exercise with the opposite side. Repeat __________ times. Complete this exercise __________ times per day.  STRETCH - Hamstrings, Standing  Stand or sit and extend your right / left leg, placing your foot on a chair or foot stool  Keeping a slight arch in your low back and your hips straight forward.  Lead with your chest and   lean forward at the waist until you feel a gentle stretch in the back of your right / left knee or thigh. (When done correctly, this exercise requires leaning only a small distance.)  Hold this position for __________ seconds. Repeat __________ times. Complete this stretch __________ times per day. STRENGTHENING - Deep Abdominals, Pelvic Tilt   Lie on a firm bed or floor. Keeping your legs in front of you, bend your knees so they are both pointed  toward the ceiling and your feet are flat on the floor.  Tense your lower abdominal muscles to press your low back into the floor. This motion will rotate your pelvis so that your tail bone is scooping upwards rather than pointing at your feet or into the floor.  With a gentle tension and even breathing, hold this position for __________ seconds. Repeat __________ times. Complete this exercise __________ times per day.  STRENGTHENING - Abdominals, Crunches   Lie on a firm bed or floor. Keeping your legs in front of you, bend your knees so they are both pointed toward the ceiling and your feet are flat on the floor. Cross your arms over your chest.  Slightly tip your chin down without bending your neck.  Tense your abdominals and slowly lift your trunk high enough to just clear your shoulder blades. Lifting higher can put excessive stress on the low back and does not further strengthen your abdominal muscles.  Control your return to the starting position. Repeat __________ times. Complete this exercise __________ times per day.  STRENGTHENING - Quadruped, Opposite UE/LE Lift   Assume a hands and knees position on a firm surface. Keep your hands under your shoulders and your knees under your hips. You may place padding under your knees for comfort.  Find your neutral spine and gently tense your abdominal muscles so that you can maintain this position. Your shoulders and hips should form a rectangle that is parallel with the floor and is not twisted.  Keeping your trunk steady, lift your right hand no higher than your shoulder and then your left leg no higher than your hip. Make sure you are not holding your breath. Hold this position __________ seconds.  Continuing to keep your abdominal muscles tense and your back steady, slowly return to your starting position. Repeat with the opposite arm and leg. Repeat __________ times. Complete this exercise __________ times per day. Document Released:  05/22/2005 Document Revised: 07/27/2011 Document Reviewed: 08/16/2008 Exit[employer] Patient Information 2015 [employer]. This information is not intended to replace advice given to you by your health care provider. Make sure you discuss any questions you have with your health care provider.  

## 2013-12-05 NOTE — Progress Notes (Signed)
Subjective:    Patient ID: Angela Hoffman, female    DOB: Apr 07, 1966, 48 y.o.   MRN: [id number]  HPI Patient with osteoarthritis in both knees. 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 Continues to have postoperative pain. Surgeries were done in [facility]. Patient had about 4 therapy visits postoperatively  Patient is on gabapentin 300 mg which are somewhat helpful for knee pain  Continues take Motrin 800 mg as needed not everyday  Uses prednisone 10 mg as needed for asthma for flareups  C/o knee pain goes to bed around 11 PM wakes up with knee pain around 2 AM.  Taking Tylenol #4 4 times per day  Patient also has a history of back pain. She reportedly received back injections at a previous pain clinic.  No recent imaging studies. No recent trauma  Reviewed spine x-rays which were basically negative Pain Inventory Average Pain 10 Pain Right Now 10 My pain is constant and aching  In the last 24 hours, has pain interfered with the following? General activity 10 Relation with others 10 Enjoyment of life 10 What TIME of day is your pain at its worst? night Sleep (in general) Poor  Pain is worse with: walking, bending and standing Pain improves with: n/a Relief from Meds: n/a  Mobility walk with assistance ability to climb steps?  yes do you drive?  no needs help with transfers Do you have any goals in this area?  yes  Function disabled: date disabled . I need assistance with the following:  dressing, bathing, household duties and shopping Do you have any goals in this area?  yes  Neuro/Psych trouble walking  Prior Studies Any changes since last visit?  no  Physicians involved in your care Any changes since last visit?  no   History reviewed. No pertinent family history. History   Social History  . Marital Status: Single    Spouse Name: N/A    Number of Children: N/A  . Years of  Education: N/A   Social History Main Topics  . Smoking status: Former Smoker -- 7 years    Types: Cigarettes  . Smokeless tobacco: Never Used  . Alcohol Use: No  . Drug Use: No  . Sexual Activity: None   Other Topics Concern  . None   Social History Narrative  . None   Past Surgical History  Procedure Laterality Date  . Cholecystectomy    . Tonsillectomy    . Tubal ligation    . Ankle surgery    . Tubal ligation    . Ankle surgery    . Replacement total knee bilateral Bilateral    Past Medical History  Diagnosis Date  . Asthma   . Hypertension    BP 137/87  Pulse 100  Resp 14  Ht 4\' 11"  (1.499 m)  Wt 160 lb (72.576 kg)  BMI 32.30 kg/m2  SpO2 96%  Opioid Risk Score:   Fall Risk Score: Moderate Fall Risk (6-13 points) (patient educated handout declined)   Review of Systems  Musculoskeletal: Positive for back pain.  All other systems reviewed and are negative.      Objective:   Physical Exam  -25 from full extension on right 105 flexion on the right -27 from full extension on the left 85 flexion on the left Knees without evidence of erythema or effusion. Healed incisions. 50% for flexion of the lumbar spine 50% extension of the  lumbar spine Tenderness to palpation L4-L5 paraspinal muscles No spine deformity     Assessment & Plan:  1. Chronic postoperative knee pain bilateral knee x-rays show no evidence of loosening or evidence of prosthetic infection. Patient has severe contracture in bilateral knees which is likely accounting for much of her pain given the biomechanical abnormalities  Have discussed stationary bicycling is a form of exercise 5-10 minutes to start with 4 times per week and  workup by 5 minutes per week with goal of 45 minutes 4 times per week Will change Tylenol 3 to Tylenol #4 and increase from 3 times a day to 4 times a day  2. Chronic low back pain no radicular signs. Will hold off on MRI. Likely related to knee contracture  altering her gait pattern will work on hamstring stretching as well as knee flexion exercises

## 2013-12-25 ENCOUNTER — Telehealth: Payer: Self-pay

## 2013-12-25 MED ORDER — ACETAMINOPHEN-CODEINE #4 300-60 MG PO TABS: 2.0000 | ORAL_TABLET | Freq: Three times a day (TID) | ORAL | Status: DC

## 2013-12-25 NOTE — Telephone Encounter (Signed)
Patient states the Tylenol #4 is not helping with her pain. Please advise.

## 2013-12-25 NOTE — Telephone Encounter (Signed)
Contacted patient to inform her Dr. Wynn BankerKirsteins changed her Tylenol #4 to 2 tablets TID. RX called in to pharmacy.

## 2013-12-25 NOTE — Telephone Encounter (Signed)
Change Tylenol #4 to 2 tabs TID  #180 1 RF

## 2014-01-03 ENCOUNTER — Encounter (HOSPITAL_COMMUNITY): Payer: Self-pay | Admitting: Emergency Medicine

## 2014-01-03 ENCOUNTER — Emergency Department (HOSPITAL_COMMUNITY): Payer: Medicaid Other

## 2014-01-03 ENCOUNTER — Emergency Department (HOSPITAL_COMMUNITY)
Admission: EM | Admit: 2014-01-03 | Discharge: 2014-01-03 | Disposition: A | Discharge status: Home / Self Care | Payer: Medicaid Other | Attending: Emergency Medicine | Admitting: Emergency Medicine

## 2014-01-03 DIAGNOSIS — J45901 Unspecified asthma with (acute) exacerbation: Secondary | ICD-10-CM | POA: Diagnosis not present

## 2014-01-03 DIAGNOSIS — R079 Chest pain, unspecified: Secondary | ICD-10-CM | POA: Diagnosis present

## 2014-01-03 DIAGNOSIS — I10 Essential (primary) hypertension: Secondary | ICD-10-CM | POA: Insufficient documentation

## 2014-01-03 DIAGNOSIS — Z79899 Other long term (current) drug therapy: Secondary | ICD-10-CM | POA: Insufficient documentation

## 2014-01-03 DIAGNOSIS — IMO0002 Reserved for concepts with insufficient information to code with codable children: Secondary | ICD-10-CM | POA: Diagnosis not present

## 2014-01-03 DIAGNOSIS — Z87891 Personal history of nicotine dependence: Secondary | ICD-10-CM | POA: Insufficient documentation

## 2014-01-03 DIAGNOSIS — J159 Unspecified bacterial pneumonia: Secondary | ICD-10-CM | POA: Insufficient documentation

## 2014-01-03 DIAGNOSIS — J189 Pneumonia, unspecified organism: Secondary | ICD-10-CM

## 2014-01-03 LAB — BASIC METABOLIC PANEL
Anion gap: 20 — ABNORMAL HIGH (ref 5–15)
BUN: 9 mg/dL (ref 6–23)
CO2: 24 mEq/L (ref 19–32)
Calcium: 9.8 mg/dL (ref 8.4–10.5)
Chloride: 97 mEq/L (ref 96–112)
Creatinine, Ser: 0.71 mg/dL (ref 0.50–1.10)
GFR calc Af Amer: >90 mL/min (ref >90)
GFR calc non Af Amer: >90 mL/min (ref >90)
Glucose, Bld: 139 mg/dL — ABNORMAL HIGH (ref 70–99)
Potassium: 3.1 mEq/L — ABNORMAL LOW (ref 3.7–5.3)
Sodium: 141 mEq/L (ref 137–147)

## 2014-01-03 LAB — CBC WITH DIFFERENTIAL/PLATELET
Basophils Absolute: 0.1 10*3/uL (ref 0.0–0.1)
Basophils Relative: 1 % (ref 0–1)
Eosinophils Absolute: 0.3 10*3/uL (ref 0.0–0.7)
Eosinophils Relative: 2 % (ref 0–5)
HCT: 38.7 % (ref 36.0–46.0)
Hemoglobin: 13.1 g/dL (ref 12.0–15.0)
Lymphocytes Relative: 14 % (ref 12–46)
Lymphs Abs: 1.6 10*3/uL (ref 0.7–4.0)
MCH: 28.6 pg (ref 26.0–34.0)
MCHC: 33.9 g/dL (ref 30.0–36.0)
MCV: 84.5 fL (ref 78.0–100.0)
Monocytes Absolute: 0.8 10*3/uL (ref 0.1–1.0)
Monocytes Relative: 7 % (ref 3–12)
Neutro Abs: 8.8 10*3/uL — ABNORMAL HIGH (ref 1.7–7.7)
Neutrophils Relative %: 76 % (ref 43–77)
Platelets: 395 10*3/uL (ref 150–400)
RBC: 4.58 MIL/uL (ref 3.87–5.11)
RDW: 15.1 % (ref 11.5–15.5)
WBC: 11.5 10*3/uL — ABNORMAL HIGH (ref 4.0–10.5)

## 2014-01-03 MED ORDER — ALBUTEROL SULFATE (2.5 MG/3ML) 0.083% IN NEBU
INHALATION_SOLUTION | RESPIRATORY_TRACT | Status: AC
  Filled 2014-01-03: qty 3

## 2014-01-03 MED ORDER — LEVOFLOXACIN IN D5W 750 MG/150ML IV SOLN
750.0000 mg | Freq: Once | INTRAVENOUS | Status: AC
  Administered 2014-01-03: 750 mg via INTRAVENOUS
  Filled 2014-01-03: qty 150

## 2014-01-03 MED ORDER — GUAIFENESIN-CODEINE 100-10 MG/5ML PO SOLN
10.0000 mL | Freq: Once | ORAL | Status: AC
  Administered 2014-01-03: 10 mL via ORAL
  Filled 2014-01-03: qty 10

## 2014-01-03 MED ORDER — ALBUTEROL SULFATE HFA 108 (90 BASE) MCG/ACT IN AERS
3.0000 | INHALATION_SPRAY | Freq: Once | RESPIRATORY_TRACT | Status: DC
  Filled 2014-01-03: qty 6.7

## 2014-01-03 MED ORDER — GUAIFENESIN-CODEINE 100-10 MG/5ML PO SYRP: 5.0000 mL | ORAL_SOLUTION | Freq: Four times a day (QID) | ORAL | Status: DC | PRN

## 2014-01-03 MED ORDER — PREDNISONE 20 MG PO TABS: 40.0000 mg | ORAL_TABLET | Freq: Every day | ORAL | Status: DC

## 2014-01-03 MED ORDER — ALBUTEROL (5 MG/ML) CONTINUOUS INHALATION SOLN
15.0000 mg/h | INHALATION_SOLUTION | RESPIRATORY_TRACT | Status: DC
  Administered 2014-01-03: 15 mg/h via RESPIRATORY_TRACT
  Filled 2014-01-03: qty 20

## 2014-01-03 MED ORDER — IPRATROPIUM-ALBUTEROL 0.5-2.5 (3) MG/3ML IN SOLN
3.0000 mL | Freq: Once | RESPIRATORY_TRACT | Status: AC
  Administered 2014-01-03: 3 mL via RESPIRATORY_TRACT
  Filled 2014-01-03: qty 3

## 2014-01-03 MED ORDER — METHYLPREDNISOLONE SODIUM SUCC 125 MG IJ SOLR
125.0000 mg | Freq: Once | INTRAMUSCULAR | Status: AC
  Administered 2014-01-03: 125 mg via INTRAVENOUS
  Filled 2014-01-03: qty 2

## 2014-01-03 MED ORDER — LEVOFLOXACIN 500 MG PO TABS: 500.0000 mg | ORAL_TABLET | Freq: Every day | ORAL | Status: DC

## 2014-01-03 NOTE — ED Notes (Signed)
Pt reports feeling mildly improved breathing, appears more comfortable.  LS remain with insp/exp wheezing.

## 2014-01-03 NOTE — ED Notes (Signed)
Patient transported to X-ray 

## 2014-01-03 NOTE — ED Provider Notes (Signed)
CSN: [id number]     Arrival date & time 01/03/14  1409 History   First MD Initiated Contact with Patient 01/03/14 1455     Chief Complaint  Patient presents with  . Shortness of Breath  . Chest Pain     (Consider location/radiation/quality/duration/timing/severity/associated sxs/prior Treatment) HPI  48 year old female with cough and shortness of breath. Onset last night and progressively worsened throughout the day today. Patient has a past history of asthma and says her current symptoms feel similar to previous exacerbations. Of note, she was admitted to the hospital 2 months ago with an asthma exacerbation. Cough is productive for yellow sputum. Some pain in the center of her chest when she coughs. Subjective fever. No unusual leg pain or swelling. No GI complaints. No sick contacts  Past Medical History  Diagnosis Date  . Asthma   . Hypertension    Past Surgical History  Procedure Laterality Date  . Cholecystectomy    . Tonsillectomy    . Tubal ligation    . Ankle surgery    . Tubal ligation    . Ankle surgery    . Replacement total knee bilateral Bilateral    No family history on file. History  Substance Use Topics  . Smoking status: Former Smoker -- 7 years    Types: Cigarettes  . Smokeless tobacco: Never Used  . Alcohol Use: No   OB History   Grav Para Term Preterm Abortions TAB SAB Ect Mult Living                 Review of Systems    Allergies  Review of patient's allergies indicates no known allergies.  Home Medications   Prior to Admission medications   Medication Sig Start Date End Date Taking? Authorizing Provider  acetaminophen-codeine (TYLENOL #4) 300-60 MG per tablet Take 2 tablets by mouth 3 (three) times daily. 12/25/13  Yes Erick Colace, MD  albuterol (PROVENTIL HFA;VENTOLIN HFA) 108 (90 BASE) MCG/ACT inhaler Inhale 2 puffs into the lungs every 6 (six) hours as needed. For wheezing   Yes Historical Provider, MD  ALPRAZolam Prudy Feeler) 1 MG  tablet Take 1 mg by mouth 2 (two) times daily.   Yes Historical Provider, MD  cyclobenzaprine (FLEXERIL) 10 MG tablet Take 1 tablet (10 mg total) by mouth at bedtime. 12/05/13  Yes Erick Colace, MD  fluticasone (FLONASE) 50 MCG/ACT nasal spray Place 1 spray into both nostrils daily. 10/30/13  Yes Belkys A Regalado, MD  gabapentin (NEURONTIN) 400 MG capsule Take 1 capsule (400 mg total) by mouth 4 (four) times daily. 09/18/13  Yes Erick Colace, MD  ipratropium (ATROVENT HFA) 17 MCG/ACT inhaler Inhale 2 puffs into the lungs every 6 (six) hours as needed for wheezing. 10/30/13  Yes Belkys A Regalado, MD  lisinopril-hydrochlorothiazide (PRINZIDE,ZESTORETIC) 20-25 MG per tablet Take 1 tablet by mouth daily.   Yes Historical Provider, MD  omeprazole (PRILOSEC) 40 MG capsule Take 40 mg by mouth daily.   Yes Historical Provider, MD  valACYclovir (VALTREX) 1000 MG tablet Take 1,000 mg by mouth daily as needed (for outbreaks).    Yes Historical Provider, MD  zolpidem (AMBIEN) 5 MG tablet Take 1 tablet (5 mg total) by mouth at bedtime as needed for sleep. 10/30/13  Yes Belkys A Regalado, MD   BP 171/88  Pulse 136  Temp(Src) 99.5 F (37.5 C) (Oral)  Resp 20  SpO2 92% Physical Exam  Nursing note and vitals reviewed. Constitutional: She appears well-developed and well-nourished. No  distress.  HENT:  Head: Normocephalic and atraumatic.  Eyes: Conjunctivae are normal. Right eye exhibits no discharge. Left eye exhibits no discharge.  Neck: Neck supple.  Cardiovascular: Normal rate, regular rhythm and normal heart sounds.  Exam reveals no gallop and no friction rub.   No murmur heard. Tachycardic  Pulmonary/Chest: She has wheezes.  Tachypnea without accessory muscle usage. Speaking in complete sentences. Inspiratory and expiratory wheezing.  Abdominal: Soft. She exhibits no distension. There is no tenderness.  Musculoskeletal: She exhibits no edema and no tenderness.  Lower extremities symmetric as  compared to each other. No calf tenderness. Negative Homan's. No palpable cords.   Neurological: She is alert.  Skin: Skin is warm and dry.  Psychiatric: She has a normal mood and affect. Her behavior is normal. Thought content normal.    ED Course  Procedures (including critical care time) Labs Review Labs Reviewed  CBC WITH DIFFERENTIAL - Abnormal; Notable for the following:    WBC 11.5 (*)    Neutro Abs 8.8 (*)    All other components within normal limits  BASIC METABOLIC PANEL - Abnormal; Notable for the following:    Potassium 3.1 (*)    Glucose, Bld 139 (*)    Anion gap 20 (*)    All other components within normal limits    Imaging Review Dg Chest 2 View  01/03/2014   CLINICAL DATA:  Chest pain with shortness of breath wheezing and dizziness for 1 week; history of asthma  EXAM: CHEST  2 VIEW  COMPARISON:  PA and lateral chest x-ray dated October 27, 2013  FINDINGS: The lungs are adequately inflated. There are increased lung markings in the left infrahilar region anteriorly which likely lie in the lingula the heart and pulmonary vascularity appear normal. There is no pleural effusion. The bony thorax is unremarkable. With exception of calcification of the anterior longitudinal ligament of the mid thoracic spine.  IMPRESSION: There is subsegmental atelectasis versus early pneumonia in the lingula. Followup films following therapy are recommended to assure clearing.   Electronically Signed   By: David  [place]   On: 01/03/2014 14:48     EKG Interpretation None      MDM   Final diagnoses:  Asthma exacerbation  HCAP (healthcare-associated pneumonia)    48 year old female with cough and shortness of breath. Likely asthma exacerbation. Cannot exclude pneumonia on x-ray. Will order antibiotics to cover for possible pneumonia. Patient did have admission 2 months ago. Ordered Levaquin. Continued nebs. Steroids. Basic labs. Admission versus discharge home pending patient's response to  treatment in emergency room.  Pt appears more comfortable. Subjectively says feels much better. o2 sats remain borderline in low 90s though. Discussed admission versus discharge with strict return precautions. She feels comfortable going home at this time. I think it's reasonable. Understands return precautions.   Raeford RazorStephen Percy Winterrowd, MD 01/08/14 [phone]

## 2014-01-03 NOTE — ED Notes (Signed)
Pt A+Ox4, presents via triage with c/o SOB/CP since last night, pt reports hx of asthma and this feels similar.  Reports central burning in chest which is not typical of her asthma.  Prod cough of yellow sputum.  Pt denies fevers/chills.  Speaking short sentences.  Audible wheezing noted.  Pt reports no improvement with home treatments.  Skin PWD.

## 2014-01-03 NOTE — Discharge Instructions (Signed)

## 2014-01-04 ENCOUNTER — Emergency Department (HOSPITAL_COMMUNITY): Payer: Medicaid Other

## 2014-01-04 ENCOUNTER — Encounter (HOSPITAL_COMMUNITY): Payer: Self-pay | Admitting: Emergency Medicine

## 2014-01-04 ENCOUNTER — Inpatient Hospital Stay (HOSPITAL_COMMUNITY)
Admission: EM | Admit: 2014-01-04 | Discharge: 2014-01-08 | DRG: 189 | Disposition: A | Discharge status: Home / Self Care | Payer: Medicaid Other | Attending: Internal Medicine | Admitting: Internal Medicine

## 2014-01-04 DIAGNOSIS — E876 Hypokalemia: Secondary | ICD-10-CM | POA: Diagnosis present

## 2014-01-04 DIAGNOSIS — M24561 Contracture, right knee: Secondary | ICD-10-CM

## 2014-01-04 DIAGNOSIS — Z79899 Other long term (current) drug therapy: Secondary | ICD-10-CM | POA: Diagnosis not present

## 2014-01-04 DIAGNOSIS — M25561 Pain in right knee: Secondary | ICD-10-CM

## 2014-01-04 DIAGNOSIS — J9601 Acute respiratory failure with hypoxia: Secondary | ICD-10-CM

## 2014-01-04 DIAGNOSIS — J189 Pneumonia, unspecified organism: Secondary | ICD-10-CM

## 2014-01-04 DIAGNOSIS — Z87891 Personal history of nicotine dependence: Secondary | ICD-10-CM | POA: Diagnosis not present

## 2014-01-04 DIAGNOSIS — J45901 Unspecified asthma with (acute) exacerbation: Secondary | ICD-10-CM | POA: Diagnosis present

## 2014-01-04 DIAGNOSIS — Z96659 Presence of unspecified artificial knee joint: Secondary | ICD-10-CM

## 2014-01-04 DIAGNOSIS — I10 Essential (primary) hypertension: Secondary | ICD-10-CM | POA: Diagnosis present

## 2014-01-04 DIAGNOSIS — J9600 Acute respiratory failure, unspecified whether with hypoxia or hypercapnia: Secondary | ICD-10-CM | POA: Diagnosis not present

## 2014-01-04 DIAGNOSIS — D72829 Elevated white blood cell count, unspecified: Secondary | ICD-10-CM | POA: Diagnosis present

## 2014-01-04 DIAGNOSIS — R000 Tachycardia, unspecified: Secondary | ICD-10-CM | POA: Diagnosis present

## 2014-01-04 DIAGNOSIS — T380X5A Adverse effect of glucocorticoids and synthetic analogues, initial encounter: Secondary | ICD-10-CM | POA: Diagnosis present

## 2014-01-04 DIAGNOSIS — R110 Nausea: Secondary | ICD-10-CM | POA: Diagnosis present

## 2014-01-04 DIAGNOSIS — M24562 Contracture, left knee: Secondary | ICD-10-CM

## 2014-01-04 DIAGNOSIS — G8929 Other chronic pain: Secondary | ICD-10-CM

## 2014-01-04 DIAGNOSIS — J209 Acute bronchitis, unspecified: Secondary | ICD-10-CM | POA: Diagnosis present

## 2014-01-04 DIAGNOSIS — G8928 Other chronic postprocedural pain: Secondary | ICD-10-CM

## 2014-01-04 DIAGNOSIS — M25562 Pain in left knee: Secondary | ICD-10-CM

## 2014-01-04 DIAGNOSIS — M549 Dorsalgia, unspecified: Secondary | ICD-10-CM

## 2014-01-04 LAB — CBC WITH DIFFERENTIAL/PLATELET
BASOS ABS: 0 10*3/uL (ref 0.0–0.1)
Basophils Relative: 0 % (ref 0–1)
EOS ABS: 0 10*3/uL (ref 0.0–0.7)
Eosinophils Relative: 0 % (ref 0–5)
HEMATOCRIT: 39.1 % (ref 36.0–46.0)
Hemoglobin: 13.4 g/dL (ref 12.0–15.0)
LYMPHS PCT: 8 % — AB (ref 12–46)
Lymphs Abs: 1.2 10*3/uL (ref 0.7–4.0)
MCH: 29.1 pg (ref 26.0–34.0)
MCHC: 34.3 g/dL (ref 30.0–36.0)
MCV: 84.8 fL (ref 78.0–100.0)
MONO ABS: 1 10*3/uL (ref 0.1–1.0)
Monocytes Relative: 7 % (ref 3–12)
Neutro Abs: 13.1 10*3/uL — ABNORMAL HIGH (ref 1.7–7.7)
Neutrophils Relative %: 85 % — ABNORMAL HIGH (ref 43–77)
Platelets: 391 10*3/uL (ref 150–400)
RBC: 4.61 MIL/uL (ref 3.87–5.11)
RDW: 15.1 % (ref 11.5–15.5)
WBC: 15.3 10*3/uL — AB (ref 4.0–10.5)

## 2014-01-04 LAB — COMPREHENSIVE METABOLIC PANEL
ALT: 16 U/L (ref 0–35)
AST: 36 U/L (ref 0–37)
Albumin: 4.2 g/dL (ref 3.5–5.2)
Alkaline Phosphatase: 67 U/L (ref 39–117)
Anion gap: 19 — ABNORMAL HIGH (ref 5–15)
BUN: 8 mg/dL (ref 6–23)
CO2: 19 meq/L (ref 19–32)
CREATININE: 0.57 mg/dL (ref 0.50–1.10)
Calcium: 10.3 mg/dL (ref 8.4–10.5)
Chloride: 98 mEq/L (ref 96–112)
GFR calc Af Amer: >90 mL/min (ref >90)
GLUCOSE: 193 mg/dL — AB (ref 70–99)
Potassium: 4.7 mEq/L (ref 3.7–5.3)
Sodium: 136 mEq/L — ABNORMAL LOW (ref 137–147)
TOTAL PROTEIN: 8.9 g/dL — AB (ref 6.0–8.3)
Total Bilirubin: 0.6 mg/dL (ref 0.3–1.2)

## 2014-01-04 LAB — PRO B NATRIURETIC PEPTIDE: PRO B NATRI PEPTIDE: 143.4 pg/mL — AB (ref 0–125)

## 2014-01-04 MED ORDER — ZOLPIDEM TARTRATE 5 MG PO TABS
5.0000 mg | ORAL_TABLET | Freq: Every evening | ORAL | Status: DC | PRN
  Administered 2014-01-05 – 2014-01-07 (×4): 5 mg via ORAL
  Filled 2014-01-04 (×5): qty 1

## 2014-01-04 MED ORDER — DEXTROSE 5 % IV SOLN: 1.0000 g | Freq: Once | INTRAVENOUS | Status: DC

## 2014-01-04 MED ORDER — IPRATROPIUM BROMIDE 0.02 % IN SOLN
RESPIRATORY_TRACT | Status: AC
  Filled 2014-01-04: qty 2.5

## 2014-01-04 MED ORDER — ONDANSETRON HCL 4 MG PO TABS: 4.0000 mg | ORAL_TABLET | Freq: Four times a day (QID) | ORAL | Status: DC | PRN

## 2014-01-04 MED ORDER — ONDANSETRON HCL 4 MG/2ML IJ SOLN
4.0000 mg | Freq: Four times a day (QID) | INTRAMUSCULAR | Status: DC | PRN
  Administered 2014-01-04: 4 mg via INTRAVENOUS
  Filled 2014-01-04: qty 2

## 2014-01-04 MED ORDER — METHYLPREDNISOLONE SODIUM SUCC 125 MG IJ SOLR
125.0000 mg | Freq: Two times a day (BID) | INTRAMUSCULAR | Status: DC
  Administered 2014-01-04 – 2014-01-07 (×7): 125 mg via INTRAVENOUS
  Filled 2014-01-04 (×10): qty 2

## 2014-01-04 MED ORDER — POLYETHYLENE GLYCOL 3350 17 G PO PACK
17.0000 g | PACK | Freq: Every day | ORAL | Status: DC | PRN
  Administered 2014-01-04: 17 g via ORAL
  Filled 2014-01-04: qty 1

## 2014-01-04 MED ORDER — POLYETHYLENE GLYCOL 3350 17 G PO PACK
17.0000 g | PACK | Freq: Every day | ORAL | Status: DC
  Administered 2014-01-05 – 2014-01-08 (×4): 17 g via ORAL
  Filled 2014-01-04 (×4): qty 1

## 2014-01-04 MED ORDER — SODIUM CHLORIDE 0.9 % IJ SOLN
3.0000 mL | Freq: Two times a day (BID) | INTRAMUSCULAR | Status: DC
  Administered 2014-01-06: 3 mL via INTRAVENOUS

## 2014-01-04 MED ORDER — SODIUM CHLORIDE 0.9 % IJ SOLN: 3.0000 mL | INTRAMUSCULAR | Status: DC | PRN

## 2014-01-04 MED ORDER — VALACYCLOVIR HCL 500 MG PO TABS
1000.0000 mg | ORAL_TABLET | Freq: Every day | ORAL | Status: DC | PRN
  Filled 2014-01-04: qty 2

## 2014-01-04 MED ORDER — ALBUTEROL SULFATE (2.5 MG/3ML) 0.083% IN NEBU
2.5000 mg | INHALATION_SOLUTION | RESPIRATORY_TRACT | Status: DC | PRN
  Administered 2014-01-05: 2.5 mg via RESPIRATORY_TRACT
  Filled 2014-01-04: qty 3

## 2014-01-04 MED ORDER — MORPHINE SULFATE 2 MG/ML IJ SOLN
0.5000 mg | INTRAMUSCULAR | Status: DC | PRN
  Administered 2014-01-04: 1 mg via INTRAVENOUS
  Filled 2014-01-04: qty 1

## 2014-01-04 MED ORDER — ALPRAZOLAM 1 MG PO TABS
1.0000 mg | ORAL_TABLET | Freq: Two times a day (BID) | ORAL | Status: DC
  Administered 2014-01-04 – 2014-01-08 (×8): 1 mg via ORAL
  Filled 2014-01-04 (×8): qty 1

## 2014-01-04 MED ORDER — LISINOPRIL-HYDROCHLOROTHIAZIDE 20-25 MG PO TABS: 1.0000 | ORAL_TABLET | Freq: Every day | ORAL | Status: DC

## 2014-01-04 MED ORDER — VANCOMYCIN HCL IN DEXTROSE 1-5 GM/200ML-% IV SOLN
1000.0000 mg | Freq: Once | INTRAVENOUS | Status: AC
  Administered 2014-01-04: 1000 mg via INTRAVENOUS
  Filled 2014-01-04: qty 200

## 2014-01-04 MED ORDER — LISINOPRIL 20 MG PO TABS
20.0000 mg | ORAL_TABLET | Freq: Every day | ORAL | Status: DC
  Administered 2014-01-04 – 2014-01-08 (×5): 20 mg via ORAL
  Filled 2014-01-04 (×5): qty 1

## 2014-01-04 MED ORDER — HYDROCHLOROTHIAZIDE 25 MG PO TABS
25.0000 mg | ORAL_TABLET | Freq: Every day | ORAL | Status: DC
  Administered 2014-01-04 – 2014-01-08 (×5): 25 mg via ORAL
  Filled 2014-01-04 (×5): qty 1

## 2014-01-04 MED ORDER — ALBUTEROL SULFATE (2.5 MG/3ML) 0.083% IN NEBU
2.5000 mg | INHALATION_SOLUTION | Freq: Once | RESPIRATORY_TRACT | Status: DC
  Filled 2014-01-04: qty 3

## 2014-01-04 MED ORDER — DEXTROSE 5 % IV SOLN
1.0000 g | Freq: Three times a day (TID) | INTRAVENOUS | Status: DC
  Filled 2014-01-04: qty 1

## 2014-01-04 MED ORDER — ONDANSETRON HCL 4 MG/2ML IJ SOLN: 4.0000 mg | Freq: Three times a day (TID) | INTRAMUSCULAR | Status: DC | PRN

## 2014-01-04 MED ORDER — DEXTROSE 5 % IV SOLN
1.0000 g | Freq: Once | INTRAVENOUS | Status: AC
  Administered 2014-01-04: 1 g via INTRAVENOUS
  Filled 2014-01-04: qty 1

## 2014-01-04 MED ORDER — IPRATROPIUM BROMIDE 0.02 % IN SOLN
1.0000 mg | Freq: Once | RESPIRATORY_TRACT | Status: AC
  Administered 2014-01-04: 1 mg via RESPIRATORY_TRACT

## 2014-01-04 MED ORDER — ACETAMINOPHEN 325 MG PO TABS
650.0000 mg | ORAL_TABLET | Freq: Four times a day (QID) | ORAL | Status: DC | PRN
  Administered 2014-01-04: 650 mg via ORAL
  Filled 2014-01-04 (×2): qty 2

## 2014-01-04 MED ORDER — ALBUTEROL SULFATE (2.5 MG/3ML) 0.083% IN NEBU
2.5000 mg | INHALATION_SOLUTION | Freq: Four times a day (QID) | RESPIRATORY_TRACT | Status: DC
  Administered 2014-01-04 – 2014-01-07 (×12): 2.5 mg via RESPIRATORY_TRACT
  Filled 2014-01-04 (×12): qty 3

## 2014-01-04 MED ORDER — VANCOMYCIN HCL IN DEXTROSE 1-5 GM/200ML-% IV SOLN: 1000.0000 mg | Freq: Two times a day (BID) | INTRAVENOUS | Status: DC

## 2014-01-04 MED ORDER — DEXTROSE 5 % IV SOLN
500.0000 mg | INTRAVENOUS | Status: DC
  Administered 2014-01-04 – 2014-01-06 (×3): 500 mg via INTRAVENOUS
  Filled 2014-01-04 (×4): qty 500

## 2014-01-04 MED ORDER — METHYLPREDNISOLONE SODIUM SUCC 125 MG IJ SOLR
125.0000 mg | Freq: Once | INTRAMUSCULAR | Status: AC
  Administered 2014-01-04: 125 mg via INTRAVENOUS
  Filled 2014-01-04: qty 2

## 2014-01-04 MED ORDER — ALBUTEROL (5 MG/ML) CONTINUOUS INHALATION SOLN
15.0000 mg/h | INHALATION_SOLUTION | RESPIRATORY_TRACT | Status: DC
  Administered 2014-01-04: 15 mg/h via RESPIRATORY_TRACT

## 2014-01-04 MED ORDER — ACETAMINOPHEN 650 MG RE SUPP: 650.0000 mg | Freq: Four times a day (QID) | RECTAL | Status: DC | PRN

## 2014-01-04 MED ORDER — ALBUTEROL (5 MG/ML) CONTINUOUS INHALATION SOLN
INHALATION_SOLUTION | RESPIRATORY_TRACT | Status: AC
  Administered 2014-01-04: 2.5 mg
  Filled 2014-01-04: qty 20

## 2014-01-04 MED ORDER — SODIUM CHLORIDE 0.9 % IV SOLN
INTRAVENOUS | Status: AC
  Administered 2014-01-04: 18:00:00 via INTRAVENOUS

## 2014-01-04 MED ORDER — HEPARIN SODIUM (PORCINE) 5000 UNIT/ML IJ SOLN
5000.0000 [IU] | Freq: Three times a day (TID) | INTRAMUSCULAR | Status: DC
  Administered 2014-01-04 – 2014-01-08 (×11): 5000 [IU] via SUBCUTANEOUS
  Filled 2014-01-04 (×14): qty 1

## 2014-01-04 MED ORDER — SODIUM CHLORIDE 0.9 % IV SOLN
250.0000 mL | INTRAVENOUS | Status: DC | PRN
  Administered 2014-01-05: 250 mL via INTRAVENOUS

## 2014-01-04 NOTE — ED Notes (Signed)
Respiratory paged and reports will come administer continuous nebulizer as requested per Jeraldine LootsLockwood MD.

## 2014-01-04 NOTE — ED Provider Notes (Signed)
CSN: [id number]     Arrival date & time 01/04/14  1208 History   First MD Initiated Contact with Patient 01/04/14 1250     Chief Complaint  Patient presents with  . Shortness of Breath  . Headache     (Consider location/radiation/quality/duration/timing/severity/associated sxs/prior Treatment) HPI Patient presents with dyspnea, cough. Patient was here yesterday, diagnosed with pneumonia.  She notes that in the interval she continues to have the aforementioned symptoms, with no new fevers, chills, but with increased work of breathing, increasing dyspnea. No no vomiting or diarrhea, though there is anorexia and nausea. Patient was admitted to 2 months ago for a pneumonia.  Past Medical History  Diagnosis Date  . Asthma   . Hypertension    Past Surgical History  Procedure Laterality Date  . Cholecystectomy    . Tonsillectomy    . Tubal ligation    . Ankle surgery    . Tubal ligation    . Ankle surgery    . Replacement total knee bilateral Bilateral    History reviewed. No pertinent family history. History  Substance Use Topics  . Smoking status: Former Smoker -- 7 years    Types: Cigarettes  . Smokeless tobacco: Never Used  . Alcohol Use: No   OB History   Grav Para Term Preterm Abortions TAB SAB Ect Mult Living                 Review of Systems  Constitutional:       Per HPI, otherwise negative  HENT:       Per HPI, otherwise negative  Respiratory:       Per HPI, otherwise negative  Cardiovascular:       Per HPI, otherwise negative  Gastrointestinal: Negative for vomiting.  Endocrine:       Negative aside from HPI  Genitourinary:       Neg aside from HPI   Musculoskeletal:       Per HPI, otherwise negative  Skin: Negative.   Neurological: Negative for syncope.      Allergies  Review of patient's allergies indicates no known allergies.  Home Medications   Prior to Admission medications   Medication Sig Start Date End Date Taking? Authorizing  Provider  acetaminophen-codeine (TYLENOL #4) 300-60 MG per tablet Take 2 tablets by mouth 3 (three) times daily. 12/25/13   Erick ColaceAndrew E Kirsteins, MD  albuterol (PROVENTIL HFA;VENTOLIN HFA) 108 (90 BASE) MCG/ACT inhaler Inhale 2 puffs into the lungs every 6 (six) hours as needed. For wheezing    Historical Provider, MD  ALPRAZolam Prudy Feeler(XANAX) 1 MG tablet Take 1 mg by mouth 2 (two) times daily.    Historical Provider, MD  cyclobenzaprine (FLEXERIL) 10 MG tablet Take 1 tablet (10 mg total) by mouth at bedtime. 12/05/13   Erick ColaceAndrew E Kirsteins, MD  fluticasone (FLONASE) 50 MCG/ACT nasal spray Place 1 spray into both nostrils daily. 10/30/13   Belkys A Regalado, MD  gabapentin (NEURONTIN) 400 MG capsule Take 1 capsule (400 mg total) by mouth 4 (four) times daily. 09/18/13   Erick ColaceAndrew E Kirsteins, MD  guaiFENesin-codeine [facility](ROBITUSSIN AC) 100-10 MG/5ML syrup Take 5-10 mLs by mouth 4 (four) times daily as needed for cough. 01/03/14   Raeford RazorStephen Kohut, MD  ipratropium (ATROVENT HFA) 17 MCG/ACT inhaler Inhale 2 puffs into the lungs every 6 (six) hours as needed for wheezing. 10/30/13   Belkys A Regalado, MD  levofloxacin (LEVAQUIN) 500 MG tablet Take 1 tablet (500 mg total) by mouth daily. 01/03/14  Raeford Razor, MD  lisinopril-hydrochlorothiazide (PRINZIDE,ZESTORETIC) 20-25 MG per tablet Take 1 tablet by mouth daily.    Historical Provider, MD  omeprazole (PRILOSEC) 40 MG capsule Take 40 mg by mouth daily.    Historical Provider, MD  predniSONE (DELTASONE) 20 MG tablet Take 2 tablets (40 mg total) by mouth daily. 01/03/14   Raeford Razor, MD  valACYclovir (VALTREX) 1000 MG tablet Take 1,000 mg by mouth daily as needed (for outbreaks).     Historical Provider, MD  zolpidem (AMBIEN) 5 MG tablet Take 1 tablet (5 mg total) by mouth at bedtime as needed for sleep. 10/30/13   Belkys A Regalado, MD   BP 129/87  Pulse 107  Temp(Src) 98.6 F (37 C) (Oral)  Resp 26  SpO2 93% Physical Exam  Nursing note and vitals  reviewed. Constitutional: She is oriented to person, place, and time. She appears well-developed and well-nourished. No distress.  HENT:  Head: Normocephalic and atraumatic.  Eyes: Conjunctivae and EOM are normal.  Cardiovascular: Regular rhythm.  Tachycardia present.   Pulmonary/Chest: Accessory muscle usage present. No stridor. Tachypnea noted. She is in respiratory distress. She has decreased breath sounds. She has wheezes. She has rhonchi.  Abdominal: She exhibits no distension.  Musculoskeletal: She exhibits no edema.  Neurological: She is alert and oriented to person, place, and time. No cranial nerve deficit.  Skin: Skin is warm and dry.  Psychiatric: She has a normal mood and affect.    ED Course  Procedures (including critical care time) Labs Review Labs Reviewed  CBC WITH DIFFERENTIAL  COMPREHENSIVE METABOLIC PANEL  PRO B NATRIURETIC PEPTIDE  URINALYSIS, ROUTINE W REFLEX MICROSCOPIC    Imaging Review Dg Chest 2 View  01/03/2014   CLINICAL DATA:  Chest pain with shortness of breath wheezing and dizziness for 1 week; history of asthma  EXAM: CHEST  2 VIEW  COMPARISON:  PA and lateral chest x-ray dated October 27, 2013  FINDINGS: The lungs are adequately inflated. There are increased lung markings in the left infrahilar region anteriorly which likely lie in the lingula the heart and pulmonary vascularity appear normal. There is no pleural effusion. The bony thorax is unremarkable. With exception of calcification of the anterior longitudinal ligament of the mid thoracic spine.  IMPRESSION: There is subsegmental atelectasis versus early pneumonia in the lingula. Followup films following therapy are recommended to assure clearing.   Electronically Signed   By: David  [place]   On: 01/03/2014 14:48    I reviewed the patient's chart, including yesterday's discharge instructions.   Pulse oximetry 92% room air abnormal   MDM   Patient presents with after being diagnosed with  pneumonia, now with failure to improve at home in spite of using appropriate medication. The patient's respiratory distress, she received continuous albuterol therapy on arrival here, and after several therapy interventions, had substantial improvement in her respiratory function. Ultimately the patient's failure to improve with home therapy, her asthma, comorbidities, she was admitted for further evaluation and management.  Gerhard Munch, MD 01/04/14 1504

## 2014-01-04 NOTE — H&P (Signed)
[facility] History and Physical  Angela Hoffman OIZ:[id number] DOB: 08-10-1965 DOA: 01/04/2014  Referring physician: Dr. Jeraldine Loots PCP: Tilda Burrow, FNP   Chief Complaint: SOB  HPI: Angela Hoffman is a 48 y.o. female past medical history of asthma, who came to the hospital the day prior to admission she was treated empirically with prednisone and antibiotic for possible acute bronchitis/pneumonia. Comes in today again complaining of shortness of breath and productive cough. She relates she cannot tolerate her shortness of breath at home she has been using her albuterol without any relief. She relates some productive cough but no fevers. She can even walk to the bathroom without being short of breath.  In the ED: Her saturations were 88% on room air, when she was put on a oxygen her saturations went up to 100. She was given one dose of vancomycin cefepime and we will consulted for further evaluation.   Review of Systems:  Constitutional:  No weight loss, night sweats, Fevers, chills, fatigue.  HEENT:  No headaches, Difficulty swallowing,Tooth/dental problems,Sore throat,  No sneezing, itching, ear ache, nasal congestion, post nasal drip,  Cardio-vascular:  No chest pain, Orthopnea, PND, swelling in lower extremities, anasarca, dizziness, palpitations  GI:  No heartburn, indigestion, abdominal pain, nausea, vomiting, diarrhea, change in bowel habits, loss of appetite  Skin:  no rash or lesions.  GU:  no dysuria, change in color of urine, no urgency or frequency. No flank pain.  Musculoskeletal:  No joint pain or swelling. No decreased range of motion. No back pain.  Psych:  No change in mood or affect. No depression or anxiety. No memory loss.   Past Medical History  Diagnosis Date  . Asthma   . Hypertension    Past Surgical History  Procedure Laterality Date  . Cholecystectomy    . Tonsillectomy    . Tubal ligation    . Ankle surgery    . Tubal ligation    . Ankle  surgery    . Replacement total knee bilateral Bilateral    Social History:  reports that she has quit smoking. Her smoking use included Cigarettes. She smoked 0.00 packs per day for 7 years. She has never used smokeless tobacco. She reports that she does not drink alcohol or use illicit drugs.  No Known Allergies  Family History  Problem Relation Age of Onset  . Kidney failure Mother   . Cirrhosis Father      Prior to Admission medications   Medication Sig Start Date End Date Taking? Authorizing Provider  albuterol (PROVENTIL HFA;VENTOLIN HFA) 108 (90 BASE) MCG/ACT inhaler Inhale 2 puffs into the lungs every 6 (six) hours as needed. For wheezing   Yes Historical Provider, MD  ALPRAZolam Prudy Feeler) 1 MG tablet Take 1 mg by mouth 2 (two) times daily.   Yes Historical Provider, MD  lisinopril-hydrochlorothiazide (PRINZIDE,ZESTORETIC) 20-25 MG per tablet Take 1 tablet by mouth daily.   Yes Historical Provider, MD  omeprazole (PRILOSEC) 40 MG capsule Take 40 mg by mouth daily.   Yes Historical Provider, MD  polyethylene glycol (MIRALAX / GLYCOLAX) packet Take 17 g by mouth daily.   Yes Historical Provider, MD  valACYclovir (VALTREX) 1000 MG tablet Take 1,000 mg by mouth daily as needed (for outbreaks).    Yes Historical Provider, MD  guaiFENesin-codeine (ROBITUSSIN AC) 100-10 MG/5ML syrup Take 5-10 mLs by mouth 3 (three) times daily as needed for cough.    Historical Provider, MD  levofloxacin (LEVAQUIN) 500 MG tablet Take 500 mg by mouth  daily.    Historical Provider, MD  predniSONE (DELTASONE) 20 MG tablet Take 20 mg by mouth daily with breakfast.    Historical Provider, MD   Physical Exam: Filed Vitals:   01/04/14 1338 01/04/14 1421 01/04/14 1506 01/04/14 1524  BP:   153/77   Pulse: 101 118 109 112  Temp:      TempSrc:      Resp: 18 22 18 17   Height:      Weight:      SpO2: 100% 99% 99% 99%    Wt Readings from Last 3 Encounters:  01/04/14 72.6 kg (160 lb 0.9 oz)  12/05/13 72.576 kg  (160 lb)  10/30/13 75.479 kg (166 lb 6.4 oz)    General:  Appears calm and comfortable Eyes: PERRL, normal lids, irises & conjunctiva ENT: grossly normal hearing, lips & tongue Neck: no LAD, masses or thyromegaly Cardiovascular: RRR, no m/r/g. No LE edema. Telemetry: SR, no arrhythmias  Respiratory: Moderate air movement with wheezing bilaterally. Abdomen: soft, ntnd Skin: no rash or induration seen on limited exam Musculoskeletal: grossly normal tone BUE/BLE Psychiatric: grossly normal mood and affect, speech fluent and appropriate Neurologic: grossly non-focal.          Labs on Admission:  Basic Metabolic Panel:  Recent Labs Lab 01/03/14 1515 01/04/14 1314  NA 141 136*  K 3.1* 4.7  CL 97 98  CO2 24 19  GLUCOSE 139* 193*  BUN 9 8  CREATININE 0.71 0.57  CALCIUM 9.8 10.3   Liver Function Tests:  Recent Labs Lab 01/04/14 1314  AST 36  ALT 16  ALKPHOS 67  BILITOT 0.6  PROT 8.9*  ALBUMIN 4.2   No results found for this basename: LIPASE, AMYLASE,  in the last 168 hours No results found for this basename: AMMONIA,  in the last 168 hours CBC:  Recent Labs Lab 01/03/14 1515 01/04/14 1314  WBC 11.5* 15.3*  NEUTROABS 8.8* 13.1*  HGB 13.1 13.4  HCT 38.7 39.1  MCV 84.5 84.8  PLT 395 391   Cardiac Enzymes: No results found for this basename: CKTOTAL, CKMB, CKMBINDEX, TROPONINI,  in the last 168 hours  BNP (last 3 results)  Recent Labs  01/04/14 1314  PROBNP 143.4*   CBG: No results found for this basename: GLUCAP,  in the last 168 hours  Radiological Exams on Admission: Dg Chest 2 View  01/04/2014   CLINICAL DATA:  Headache with shortness of breath.  EXAM: CHEST  2 VIEW  COMPARISON:  PA and lateral chest of January 03, 2014  FINDINGS: The lungs are adequately inflated. The perihilar lung markings are prominent. There is no alveolar pneumonia. There is no pulmonary edema or pleural effusion. The cardiac silhouette is normal. The mediastinum is normal in  width. There is calcification of the anterior longitudinal ligament of the thoracic spine.  IMPRESSION: Increased perihilar lung markings may reflect acute bronchitis with subsegmental atelectasis. There is no focal pneumonia. Follow-up films are recommended following any therapy to assure clearing.   Electronically Signed   By: David  [place]   On: 01/04/2014 14:39   Dg Chest 2 View  01/03/2014   CLINICAL DATA:  Chest pain with shortness of breath wheezing and dizziness for 1 week; history of asthma  EXAM: CHEST  2 VIEW  COMPARISON:  PA and lateral chest x-ray dated October 27, 2013  FINDINGS: The lungs are adequately inflated. There are increased lung markings in the left infrahilar region anteriorly which likely lie in the lingula the  heart and pulmonary vascularity appear normal. There is no pleural effusion. The bony thorax is unremarkable. With exception of calcification of the anterior longitudinal ligament of the mid thoracic spine.  IMPRESSION: There is subsegmental atelectasis versus early pneumonia in the lingula. Followup films following therapy are recommended to assure clearing.   Electronically Signed   By: David  [place]   On: 01/03/2014 14:48    EKG: Independently reviewed. Sinus tachycardia normal axis no T wave abnormalities.  Assessment/Plan Acute respiratory failure with hypoxemia/ due Asthma exacerbation and  Acute bronchitis - I agree with bringing her into the hospital and starting empiric antibiotics, will continue her on azithromycin. Agree and will continue IV steroids. Continue her nasal cannula and check oxygen saturations. - Her mucous membranes are dry his we'll continue her on IV fluids for 12 hours. Continue to give her a regular diet.   Leukocytosis: - This probably due to steroids she was started on steroids 24 hours prior to admission on her last visit to the ED.   Code Status: full DVT Prophylaxis:heparin Family Communication: none  Disposition Plan:  inpatient  Time spent: 75 minutes  Marinda ElkFELIZ ORTIZ, Cyrilla Durkin [facility] Pager [phone]  **Disclaimer: This note may have been dictated with voice recognition software. Similar sounding words can inadvertently be transcribed and this note may contain transcription errors which may not have been corrected upon publication of note.**

## 2014-01-04 NOTE — Progress Notes (Signed)
ANTIBIOTIC CONSULT NOTE - INITIAL  Pharmacy Consult for vancomycin, cefepime Indication: HAP  No Known Allergies  Patient Measurements: Height: 4' 11.06" (150 cm) Weight: 160 lb 0.9 oz (72.6 kg) IBW/kg (Calculated) : 43.33  Vital Signs: Temp: 98.6 F (37 C) (08/20 1249) Temp src: Oral (08/20 1249) BP: 129/87 mmHg (08/20 1249) Pulse Rate: 107 (08/20 1259) Intake/Output from previous day:   Intake/Output from this shift:    Labs:  Recent Labs  01/03/14 1515  WBC 11.5*  HGB 13.1  PLT 395  CREATININE 0.71   Estimated Creatinine Clearance: 76.3 ml/min (by C-G formula based on Cr of 0.71). No results found for this basename: VANCOTROUGH, VANCOPEAK, VANCORANDOM, GENTTROUGH, GENTPEAK, GENTRANDOM, TOBRATROUGH, TOBRAPEAK, TOBRARND, AMIKACINPEAK, AMIKACINTROU, AMIKACIN,  in the last 72 hours   Microbiology: No results found for this or any previous visit (from the past 720 hour(s)).  Medical History: Past Medical History  Diagnosis Date  . Asthma   . Hypertension     Medications:  Scheduled:  . albuterol      . ipratropium      . ipratropium  1 mg Nebulization Once  . methylPREDNISolone (SOLU-MEDROL) injection  125 mg Intravenous Once   Infusions:  . albuterol    . ceFEPime (MAXIPIME) IV     Assessment: 48 yo female presented to ER with continued SOB, HA, productive cough and hx asthma. Patient was just in ER yesterday as well and was diagnosed with HAP at that time secondary to being admitted and treated for PNA 2 months ago, but she did not want to be admitted yesterday. To be admitted for treatment of HCAP today with vancomycin and cefepime dosing per pharmacy.  8/20 >> vancomycin >> 8/20 >> cefepime >>   Afebrile  WBC WNL  SCr 0.73, CrCl 76   Goal of Therapy:  Vancomycin trough level 15-20 mcg/ml  Plan:  1) vancomycin 1g IV q12 x 8 days 2) cefepime 1g IV q8 x 8 days   Hessie KnowsJustin M Teddrick Mallari, PharmD, BCPS Pager [phone] 01/04/2014 1:16 PM

## 2014-01-04 NOTE — Evaluation (Signed)
Pt with cough, pleuritic chest pain and HA x2 days. Admitted for acute reps failure/asthma/bronchitis. Has been on tylenol with no relief. CP non cardiac per nurse. Only with coughing and deep breathing. Will given trial of low dose morphine. Reassess if not improved.

## 2014-01-04 NOTE — Progress Notes (Signed)
UR completed 

## 2014-01-04 NOTE — Progress Notes (Signed)
  CARE MANAGEMENT ED NOTE 01/04/2014  Patient:  Littleton,Tija   Account Number:  [id number]  Date Initiated:  01/04/2014  Documentation initiated by:  Edd ArbourGIBBS,Rafal Archuleta  Subjective/Objective Assessment:   48 yr old medicaid [place] c/o headache and SOB x 3 days.  Pain score 10/10.  [facility] same and diagnosed w/ asthma exacerbation and Hcap     Subjective/Objective Assessment Detail:   [place] health agency is  [facility] [place]    [address] [place]  [id number]  [phone]  E-mail address  [email or web address]     Action/Plan:   ED CM spoke with pt to confirm home health agency   Action/Plan Detail:   Anticipated DC Date:       Status Recommendation to Physician:   Result of Recommendation:    Other ED Services  Consult Working Plan    DC Associate [facility]  Other  Outpatient Services - Pt will follow up   [facility]   Choice offered to / List presented to:  C-1 Patient     HH arranged  HH-1 RN      [facility]  OTHER - SEE NOTE    Status of service:  Completed, signed off  ED Comments:   ED Comments Detail:

## 2014-01-04 NOTE — ED Notes (Addendum)
Pt c/o headache and SOB x 3 days.  Pain score 10/10.  [facility] and diagnosed w/ asthma exacerbation and Hcap.  Pt reports "he was gonna admit me be, but I said no.  He told me to come back if I didn't get any better.  I haven't taken any of the prescribed medication, because they told me I was good."  Hx asthma.

## 2014-01-05 LAB — BASIC METABOLIC PANEL
Anion gap: 19 — ABNORMAL HIGH (ref 5–15)
BUN: 10 mg/dL (ref 6–23)
CO2: 20 meq/L (ref 19–32)
Calcium: 10 mg/dL (ref 8.4–10.5)
Chloride: 98 mEq/L (ref 96–112)
Creatinine, Ser: 0.69 mg/dL (ref 0.50–1.10)
GFR calc non Af Amer: >90 mL/min (ref >90)
Glucose, Bld: 310 mg/dL — ABNORMAL HIGH (ref 70–99)
POTASSIUM: 3.5 meq/L — AB (ref 3.7–5.3)
SODIUM: 137 meq/L (ref 137–147)

## 2014-01-05 LAB — CBC
HCT: 36.4 % (ref 36.0–46.0)
Hemoglobin: 12 g/dL (ref 12.0–15.0)
MCH: 28.2 pg (ref 26.0–34.0)
MCHC: 33 g/dL (ref 30.0–36.0)
MCV: 85.4 fL (ref 78.0–100.0)
Platelets: 381 10*3/uL (ref 150–400)
RBC: 4.26 MIL/uL (ref 3.87–5.11)
RDW: 15.4 % (ref 11.5–15.5)
WBC: 17 10*3/uL — ABNORMAL HIGH (ref 4.0–10.5)

## 2014-01-05 MED ORDER — HYDROCODONE-ACETAMINOPHEN 5-325 MG PO TABS
1.0000 | ORAL_TABLET | Freq: Four times a day (QID) | ORAL | Status: DC | PRN
  Administered 2014-01-05 – 2014-01-06 (×3): 1 via ORAL
  Filled 2014-01-05 (×3): qty 1

## 2014-01-05 MED ORDER — GABAPENTIN 400 MG PO CAPS
400.0000 mg | ORAL_CAPSULE | Freq: Three times a day (TID) | ORAL | Status: DC
  Administered 2014-01-05 – 2014-01-08 (×10): 400 mg via ORAL
  Filled 2014-01-05 (×12): qty 1

## 2014-01-05 MED ORDER — POTASSIUM CHLORIDE CRYS ER 20 MEQ PO TBCR
40.0000 meq | EXTENDED_RELEASE_TABLET | Freq: Two times a day (BID) | ORAL | Status: AC
  Administered 2014-01-05 (×2): 40 meq via ORAL
  Filled 2014-01-05 (×2): qty 2

## 2014-01-05 NOTE — Progress Notes (Signed)
[facility] PROGRESS NOTE  Assessment/Plan: Acute respiratory failure with hypoxemia/ Acute bronchitis/  Asthma exacerbation: - On azithro and IV steroids. Afebrile, cont to have wheezing b/l - afebrile, sat remains > 90% on RA. - cont to be tachycardic most likely due to acute bronchitis and asthma. - ambulate and check sat in am.    Code Status: full  DVT Prophylaxis:heparin  Family Communication: none  Disposition Plan: inpatient    Consultants:  none  Procedures:  CXR  Antibiotics:  azithro  HPI/Subjective: Relates SOB some what better  Objective: Filed Vitals:   01/04/14 1954 01/04/14 2109 01/05/14 0224 01/05/14 0527  BP:  131/77  141/78  Pulse:  115  112  Temp:  98.2 F (36.8 C)  98.6 F (37 C)  TempSrc:  Oral  Oral  Resp:      Height:      Weight:    74.027 kg (163 lb 3.2 oz)  SpO2: 92% 92% 92% 94%    Intake/Output Summary (Last 24 hours) at 01/05/14 0836 Last data filed at 01/04/14 1800  Gross per 24 hour  Intake    240 ml  Output      0 ml  Net    240 ml   Filed Weights   01/04/14 1300 01/04/14 1813 01/05/14 0527  Weight: 72.6 kg (160 lb 0.9 oz) 76.159 kg (167 lb 14.4 oz) 74.027 kg (163 lb 3.2 oz)    Exam:  General: Alert, awake, oriented x3, in no acute distress.  HEENT: No bruits, no goiter.  Heart: Regular rate and rhythm, without murmurs, rubs, gallops.  Lungs: Good air movement, wheezing B/L Abdomen: Soft, nontender, nondistended, positive bowel sounds.    Data Reviewed: Basic Metabolic Panel:  Recent Labs Lab 01/03/14 1515 01/04/14 1314 01/05/14 0527  NA 141 136* 137  K 3.1* 4.7 3.5*  CL 97 98 98  CO2 24 19 20   GLUCOSE 139* 193* 310*  BUN 9 8 10   CREATININE 0.71 0.57 0.69  CALCIUM 9.8 10.3 10.0   Liver Function Tests:  Recent Labs Lab 01/04/14 1314  AST 36  ALT 16  ALKPHOS 67  BILITOT 0.6  PROT 8.9*  ALBUMIN 4.2   No results found for this basename: LIPASE, AMYLASE,  in the last 168 hours No  results found for this basename: AMMONIA,  in the last 168 hours CBC:  Recent Labs Lab 01/03/14 1515 01/04/14 1314 01/05/14 0527  WBC 11.5* 15.3* 17.0*  NEUTROABS 8.8* 13.1*  --   HGB 13.1 13.4 12.0  HCT 38.7 39.1 36.4  MCV 84.5 84.8 85.4  PLT 395 391 381   Cardiac Enzymes: No results found for this basename: CKTOTAL, CKMB, CKMBINDEX, TROPONINI,  in the last 168 hours BNP (last 3 results)  Recent Labs  01/04/14 1314  PROBNP 143.4*   CBG: No results found for this basename: GLUCAP,  in the last 168 hours  No results found for this or any previous visit (from the past 240 hour(s)).   Studies: Dg Chest 2 View  01/04/2014   CLINICAL DATA:  Headache with shortness of breath.  EXAM: CHEST  2 VIEW  COMPARISON:  PA and lateral chest of January 03, 2014  FINDINGS: The lungs are adequately inflated. The perihilar lung markings are prominent. There is no alveolar pneumonia. There is no pulmonary edema or pleural effusion. The cardiac silhouette is normal. The mediastinum is normal in width. There is calcification of the anterior longitudinal ligament of the thoracic spine.  IMPRESSION:  Increased perihilar lung markings may reflect acute bronchitis with subsegmental atelectasis. There is no focal pneumonia. Follow-up films are recommended following any therapy to assure clearing.   Electronically Signed   By: David  [place]   On: 01/04/2014 14:39   Dg Chest 2 View  01/03/2014   CLINICAL DATA:  Chest pain with shortness of breath wheezing and dizziness for 1 week; history of asthma  EXAM: CHEST  2 VIEW  COMPARISON:  PA and lateral chest x-ray dated October 27, 2013  FINDINGS: The lungs are adequately inflated. There are increased lung markings in the left infrahilar region anteriorly which likely lie in the lingula the heart and pulmonary vascularity appear normal. There is no pleural effusion. The bony thorax is unremarkable. With exception of calcification of the anterior longitudinal ligament of  the mid thoracic spine.  IMPRESSION: There is subsegmental atelectasis versus early pneumonia in the lingula. Followup films following therapy are recommended to assure clearing.   Electronically Signed   By: David  [place]   On: 01/03/2014 14:48    Scheduled Meds: . albuterol  2.5 mg Nebulization Q6H  . ALPRAZolam  1 mg Oral BID  . azithromycin  500 mg Intravenous Q24H  . heparin  5,000 Units Subcutaneous 3 times per day  . lisinopril  20 mg Oral Daily   And  . hydrochlorothiazide  25 mg Oral Daily  . methylPREDNISolone (SOLU-MEDROL) injection  125 mg Intravenous Q12H  . polyethylene glycol  17 g Oral Daily  . potassium chloride  40 mEq Oral BID  . sodium chloride  3 mL Intravenous Q12H   Continuous Infusions:    Marinda ElkFELIZ ORTIZ, ABRAHAM  [facility] Pager [phone]. If 8PM-8AM, please contact night-coverage at [email or web address], password [email or web address] 01/05/2014, 8:36 AM  LOS: 1 day      **Disclaimer: This note may have been dictated with voice recognition software. Similar sounding words can inadvertently be transcribed and this note may contain transcription errors which may not have been corrected upon publication of note.**

## 2014-01-05 NOTE — Progress Notes (Signed)
Results for Angela Hoffman, Angela (MRN [id number]) as of 01/05/2014 13:25  Ref. Range 01/03/2014 15:15 01/04/2014 13:14 01/05/2014 05:27  Glucose Latest Range: 70-99 mg/dL 409139 (H) 811193 (H) 914310 (H)  CBGs elevated.  Admitted with asthma exacerbation. On steroids. No history of diabetes noted.  Recommend checking HgbA1C for home blood glucose control. If CBGs continue to be greater than 180 mg/dl, recommend starting Novolog SENSITIVE correction scale TID & HS especially while on steroids. Smith MinceKendra Shiraz Bastyr RN BSN CDE

## 2014-01-05 NOTE — Progress Notes (Signed)
O2 sats 91-94% on RA while ambulating in hall per tech

## 2014-01-05 NOTE — Progress Notes (Signed)
Nutrition Brief Note  Patient identified on the Malnutrition Screening Tool (MST) Report  Wt Readings from Last 15 Encounters:  01/05/14 163 lb 3.2 oz (74.027 kg)  12/05/13 160 lb (72.576 kg)  10/30/13 166 lb 6.4 oz (75.479 kg)  10/23/13 162 lb (73.483 kg)  09/18/13 162 lb 9.6 oz (73.755 kg)    Body mass index is 32.94 kg/(m^2). Patient meets criteria for class I based on current BMI.   Current diet order is regular, patient is consuming approximately 100% of meals at this time. Labs and medications reviewed. Potassium slightly low, getting oral replacement. Admitted with shortness of breath, hx of asthma and HTN. Pt discussed during multidisciplinary rounds. Met with pt who reports excellent appetite at home, 3-4 meals/day. Said she's lost 3 pounds in the past 1-2 weeks but was unsure what was causing this as she had no changes in her diet or exercise. Weight trend shows pt's weight up 3 pounds in the past month. Currently eating excellent with no nutrition concerns. Had a few dietary questions about healthy weight loss which were answered.   No nutrition interventions warranted at this time. If nutrition issues arise, please consult RD.   Carlis Stable MS, [place], LDN [phone] Pager [phone] Weekend/After Hours Pager

## 2014-01-06 MED ORDER — HYDROCODONE-ACETAMINOPHEN 5-325 MG PO TABS
2.0000 | ORAL_TABLET | ORAL | Status: DC | PRN
  Administered 2014-01-06 – 2014-01-08 (×8): 2 via ORAL
  Filled 2014-01-06 (×8): qty 2

## 2014-01-06 NOTE — Progress Notes (Signed)
Informed by resp therapy pt sats were 87% on RA and started on O2 @2L  via [place]

## 2014-01-06 NOTE — Progress Notes (Signed)
[facility] PROGRESS NOTE  Assessment/Plan: Acute respiratory failure with hypoxemia/ Acute bronchitis/  Asthma exacerbation: - chane to oral azithro and steroids.  - afebrile, sat remains > 90% with ambulation. - Tachycardia resolved.   Code Status: full  DVT Prophylaxis:heparin  Family Communication: none  Disposition Plan: inpatient    Consultants:  none  Procedures:  CXR  Antibiotics:  azithro  HPI/Subjective: SOB improved.  Objective: Filed Vitals:   01/06/14 0116 01/06/14 0124 01/06/14 0500 01/06/14 0800  BP:   158/90   Pulse:   91   Temp:   97.5 F (36.4 C)   TempSrc:   Oral   Resp:   18   Height:      Weight:      SpO2: 87% 99% 100% 100%   No intake or output data in the 24 hours ending 01/06/14 1029 Filed Weights   01/04/14 1300 01/04/14 1813 01/05/14 0527  Weight: 72.6 kg (160 lb 0.9 oz) 76.159 kg (167 lb 14.4 oz) 74.027 kg (163 lb 3.2 oz)    Exam:  General: Alert, awake, oriented x3, in no acute distress.  HEENT: No bruits, no goiter.  Heart: Regular rate and rhythm. Lungs: Good air movement, clear Abdomen: Soft, nontender, nondistended, positive bowel sounds.    Data Reviewed: Basic Metabolic Panel:  Recent Labs Lab 01/03/14 1515 01/04/14 1314 01/05/14 0527  NA 141 136* 137  K 3.1* 4.7 3.5*  CL 97 98 98  CO2 GLUCOSE 139* 193* 310*  BUN CREATININE 0.71 0.57 0.69  CALCIUM 9.8 10.3 10.0   Liver Function Tests:  Recent Labs Lab 01/04/14 1314  AST 36  ALT 16  ALKPHOS 67  BILITOT 0.6  PROT 8.9*  ALBUMIN 4.2   No results found for this basename: LIPASE, AMYLASE,  in the last 168 hours No results found for this basename: AMMONIA,  in the last 168 hours CBC:  Recent Labs Lab 01/03/14 1515 01/04/14 1314 01/05/14 0527  WBC 11.5* 15.3* 17.0*  NEUTROABS 8.8* 13.1*  --   HGB 13.1 13.4 12.0  HCT 38.7 39.1 36.4  MCV 84.5 84.8 85.4  PLT 395 391 381   Cardiac Enzymes: No results found for  this basename: CKTOTAL, CKMB, CKMBINDEX, TROPONINI,  in the last 168 hours BNP (last 3 results)  Recent Labs  01/04/14 1314  PROBNP 143.4*   CBG: No results found for this basename: GLUCAP,  in the last 168 hours  No results found for this or any previous visit (from the past 240 hour(s)).   Studies: Dg Chest 2 View  01/04/2014   CLINICAL DATA:  Headache with shortness of breath.  EXAM: CHEST  2 VIEW  COMPARISON:  PA and lateral chest of January 03, 2014  FINDINGS: The lungs are adequately inflated. The perihilar lung markings are prominent. There is no alveolar pneumonia. There is no pulmonary edema or pleural effusion. The cardiac silhouette is normal. The mediastinum is normal in width. There is calcification of the anterior longitudinal ligament of the thoracic spine.  IMPRESSION: Increased perihilar lung markings may reflect acute bronchitis with subsegmental atelectasis. There is no focal pneumonia. Follow-up films are recommended following any therapy to assure clearing.   Electronically Signed   By: David  [place]   On: 01/04/2014 14:39    Scheduled Meds: . albuterol  2.5 mg Nebulization Q6H  . ALPRAZolam  1 mg Oral BID  . azithromycin  500 mg Intravenous Q24H  . gabapentin  400 mg Oral TID  . heparin  5,000 Units Subcutaneous 3 times per day  . lisinopril  20 mg Oral Daily   And  . hydrochlorothiazide  25 mg Oral Daily  . methylPREDNISolone (SOLU-MEDROL) injection  125 mg Intravenous Q12H  . polyethylene glycol  17 g Oral Daily  . sodium chloride  3 mL Intravenous Q12H   Continuous Infusions:    Marinda Elk  [facility] Pager [phone]. If 8PM-8AM, please contact night-coverage at [email or web address], password [facility] 01/06/2014, 10:29 AM  LOS: 2 days      **Disclaimer: This note may have been dictated with voice recognition software. Similar sounding words can inadvertently be transcribed and this note may contain transcription errors which may not have been  corrected upon publication of note.**

## 2014-01-07 DIAGNOSIS — J189 Pneumonia, unspecified organism: Secondary | ICD-10-CM

## 2014-01-07 DIAGNOSIS — E876 Hypokalemia: Secondary | ICD-10-CM

## 2014-01-07 LAB — BASIC METABOLIC PANEL
ANION GAP: 16 — AB (ref 5–15)
BUN: 19 mg/dL (ref 6–23)
CO2: 26 meq/L (ref 19–32)
Calcium: 9.6 mg/dL (ref 8.4–10.5)
Chloride: 95 mEq/L — ABNORMAL LOW (ref 96–112)
Creatinine, Ser: 0.59 mg/dL (ref 0.50–1.10)
GFR calc non Af Amer: >90 mL/min (ref >90)
Glucose, Bld: 256 mg/dL — ABNORMAL HIGH (ref 70–99)
POTASSIUM: 3.8 meq/L (ref 3.7–5.3)
Sodium: 137 mEq/L (ref 137–147)

## 2014-01-07 MED ORDER — POTASSIUM CHLORIDE CRYS ER 20 MEQ PO TBCR
40.0000 meq | EXTENDED_RELEASE_TABLET | Freq: Two times a day (BID) | ORAL | Status: AC
  Administered 2014-01-07 (×2): 40 meq via ORAL
  Filled 2014-01-07 (×2): qty 2

## 2014-01-07 MED ORDER — ALBUTEROL SULFATE (2.5 MG/3ML) 0.083% IN NEBU
2.5000 mg | INHALATION_SOLUTION | Freq: Three times a day (TID) | RESPIRATORY_TRACT | Status: DC
  Administered 2014-01-07 – 2014-01-08 (×2): 2.5 mg via RESPIRATORY_TRACT
  Filled 2014-01-07 (×2): qty 3

## 2014-01-07 MED ORDER — MAGNESIUM OXIDE 400 (241.3 MG) MG PO TABS
400.0000 mg | ORAL_TABLET | Freq: Two times a day (BID) | ORAL | Status: AC
  Administered 2014-01-07 (×2): 400 mg via ORAL
  Filled 2014-01-07 (×2): qty 1

## 2014-01-07 MED ORDER — HYDRALAZINE HCL 20 MG/ML IJ SOLN
5.0000 mg | Freq: Once | INTRAMUSCULAR | Status: AC
  Administered 2014-01-07: 5 mg via INTRAVENOUS
  Filled 2014-01-07: qty 0.25

## 2014-01-07 MED ORDER — AZITHROMYCIN 500 MG PO TABS
500.0000 mg | ORAL_TABLET | Freq: Every day | ORAL | Status: DC
  Administered 2014-01-07: 500 mg via ORAL
  Filled 2014-01-07 (×2): qty 1

## 2014-01-07 NOTE — Progress Notes (Signed)
[facility] PROGRESS NOTE  Assessment/Plan: Acute respiratory failure with hypoxemia/ Acute bronchitis/  Asthma exacerbation: - chane to oral azithro and steroids.  - afebrile, sat remains > 90% with ambulation. - Tachycardia resolved.  Hypokalemia: - replete k and check a mag.   Code Status: full  DVT Prophylaxis:heparin  Family Communication: none  Disposition Plan: inpatient    Consultants:  none  Procedures:  CXR  Antibiotics:  azithro  HPI/Subjective: No complains.  Objective: Filed Vitals:   01/07/14 0558 01/07/14 0610 01/07/14 0642 01/07/14 0748  BP: 186/110 155/100    Pulse: 96     Temp: 98 F (36.7 C)     TempSrc: Oral     Resp: 18     Height:      Weight:   75.161 kg (165 lb 11.2 oz)   SpO2: 95%   96%    Intake/Output Summary (Last 24 hours) at 01/07/14 1053 Last data filed at 01/07/14 1000  Gross per 24 hour  Intake    360 ml  Output    375 ml  Net    -15 ml   Filed Weights   01/04/14 1813 01/05/14 0527 01/07/14 0642  Weight: 76.159 kg (167 lb 14.4 oz) 74.027 kg (163 lb 3.2 oz) 75.161 kg (165 lb 11.2 oz)    Exam:  General: Alert, awake, oriented x3, in no acute distress.  HEENT: No bruits, no goiter.  Heart: Regular rate and rhythm. Lungs: Good air movement, clear Abdomen: Soft, nontender, nondistended, positive bowel sounds.    Data Reviewed: Basic Metabolic Panel:  Recent Labs Lab 01/03/14 1515 01/04/14 1314 01/05/14 0527  NA 141 136* 137  K 3.1* 4.7 3.5*  CL 97 98 98  CO2 GLUCOSE 139* 193* 310*  BUN CREATININE 0.71 0.57 0.69  CALCIUM 9.8 10.3 10.0   Liver Function Tests:  Recent Labs Lab 01/04/14 1314  AST 36  ALT 16  ALKPHOS 67  BILITOT 0.6  PROT 8.9*  ALBUMIN 4.2   No results found for this basename: LIPASE, AMYLASE,  in the last 168 hours No results found for this basename: AMMONIA,  in the last 168 hours CBC:  Recent Labs Lab 01/03/14 1515 01/04/14 1314  01/05/14 0527  WBC 11.5* 15.3* 17.0*  NEUTROABS 8.8* 13.1*  --   HGB 13.1 13.4 12.0  HCT 38.7 39.1 36.4  MCV 84.5 84.8 85.4  PLT 395 391 381   Cardiac Enzymes: No results found for this basename: CKTOTAL, CKMB, CKMBINDEX, TROPONINI,  in the last 168 hours BNP (last 3 results)  Recent Labs  01/04/14 1314  PROBNP 143.4*   CBG: No results found for this basename: GLUCAP,  in the last 168 hours  No results found for this or any previous visit (from the past 240 hour(s)).   Studies: No results found.  Scheduled Meds: . albuterol  2.5 mg Nebulization Q6H  . ALPRAZolam  1 mg Oral BID  . azithromycin  500 mg Oral QAC supper  . gabapentin  400 mg Oral TID  . heparin  5,000 Units Subcutaneous 3 times per day  . lisinopril  20 mg Oral Daily   And  . hydrochlorothiazide  25 mg Oral Daily  . methylPREDNISolone (SOLU-MEDROL) injection  125 mg Intravenous Q12H  . polyethylene glycol  17 g Oral Daily  . potassium chloride  40 mEq Oral BID  . sodium chloride  3 mL Intravenous Q12H   Continuous Infusions:  Marinda Elk  [facility] Pager [phone]. If 8PM-8AM, please contact night-coverage at [email or web address], password [facility] 01/07/2014, 10:53 AM  LOS: 3 days      **Disclaimer: This note may have been dictated with voice recognition software. Similar sounding words can inadvertently be transcribed and this note may contain transcription errors which may not have been corrected upon publication of note.**

## 2014-01-08 MED ORDER — PREDNISONE 50 MG PO TABS
60.0000 mg | ORAL_TABLET | Freq: Once | ORAL | Status: AC
  Administered 2014-01-08: 60 mg via ORAL
  Filled 2014-01-08: qty 1

## 2014-01-08 MED ORDER — LEVOFLOXACIN 500 MG PO TABS: 500.0000 mg | ORAL_TABLET | Freq: Every day | ORAL | Status: DC

## 2014-01-08 MED ORDER — AZITHROMYCIN 500 MG PO TABS: 500.0000 mg | ORAL_TABLET | Freq: Every day | ORAL | Status: DC

## 2014-01-08 MED ORDER — CLONIDINE HCL 0.1 MG PO TABS
0.1000 mg | ORAL_TABLET | Freq: Once | ORAL | Status: AC
  Administered 2014-01-08: 0.1 mg via ORAL
  Filled 2014-01-08: qty 1

## 2014-01-08 MED ORDER — PREDNISONE 10 MG PO TABS: ORAL_TABLET | ORAL | Status: DC

## 2014-01-08 NOTE — Progress Notes (Signed)
Discharge instructions reviewed with patient, questions answered, verbalized understanding.  Patient transported via wheelchair to front of hospital to be taken home by significant other.  Patient in good condition at time of discharge from [facility].

## 2014-01-08 NOTE — Discharge Summary (Signed)
Physician Discharge Summary  Angela Hoffman ZOX:[id number] DOB: 14-May-1966 DOA: 01/04/2014  PCP: Tilda Burrow, FNP  Admit date: 01/04/2014 Discharge date: 01/08/2014  Time spent: 35 minutes  Recommendations for Outpatient Follow-up:  Follow up with Pulmonary as an outpatient.  Discharge Diagnoses:  Principal Problem:   Acute respiratory failure with hypoxemia Active Problems:   Asthma exacerbation   Acute bronchitis   Leukocytosis   Acute respiratory failure with hypoxia   Discharge Condition: stable  Diet recommendation: regular  Filed Weights   01/04/14 1813 01/05/14 0527 01/07/14 0642  Weight: 76.159 kg (167 lb 14.4 oz) 74.027 kg (163 lb 3.2 oz) 75.161 kg (165 lb 11.2 oz)    History of present illness:  48 y.o. female past medical history of asthma, who came to the hospital the day prior to admission she was treated empirically with prednisone and antibiotic for possible acute bronchitis/pneumonia. Comes in today again complaining of shortness of breath and productive cough. She relates she cannot tolerate her shortness of breath at home she has been using her albuterol without any relief. She relates some productive cough but no fevers. She can even walk to the bathroom without being short of breath.   Hospital Course:  Acute respiratory failure with hypoxemia/ Acute bronchitis/ Asthma exacerbation:  - Started on IV azithro and steroids once improved,chane to oral azithro and steroids.  - afebrile, sat remains > 90% with ambulation.  - Home on antibiotics for 3 days and steroid tapared.  Hypokalemia:  - replete k and check a mag.    Procedures:  CXR  Consultations:  none  Discharge Exam: Filed Vitals:   01/08/14 0644  BP: 190/100  Pulse:   Temp:   Resp:     General: A&O x3 Cardiovascular: RRR Respiratory: good air movement CTA B/L  Discharge Instructions You were cared for by a hospitalist during your hospital stay. If you have any questions about  your discharge medications or the care you received while you were in the hospital after you are discharged, you can call the unit and asked to speak with the hospitalist on call if the hospitalist that took care of you is not available. Once you are discharged, your primary care physician will handle any further medical issues. Please note that NO REFILLS for any discharge medications will be authorized once you are discharged, as it is imperative that you return to your primary care physician (or establish a relationship with a primary care physician if you do not have one) for your aftercare needs so that they can reassess your need for medications and monitor your lab values.      Discharge Instructions   Diet - low sodium heart healthy    Complete by:  As directed      Increase activity slowly    Complete by:  As directed             Medication List         albuterol 108 (90 BASE) MCG/ACT inhaler  Commonly known as:  PROVENTIL HFA;VENTOLIN HFA  Inhale 2 puffs into the lungs every 6 (six) hours as needed. For wheezing     ALPRAZolam 1 MG tablet  Commonly known as:  XANAX  Take 1 mg by mouth 2 (two) times daily.     azithromycin 500 MG tablet  Commonly known as:  ZITHROMAX  Take 1 tablet (500 mg total) by mouth daily before supper.     guaiFENesin-codeine 100-10 MG/5ML syrup  Commonly known as:  ROBITUSSIN AC  Take 5-10 mLs by mouth 3 (three) times daily as needed for cough.     levofloxacin 500 MG tablet  Commonly known as:  LEVAQUIN  Take 1 tablet (500 mg total) by mouth daily.     lisinopril-hydrochlorothiazide 20-25 MG per tablet  Commonly known as:  PRINZIDE,ZESTORETIC  Take 1 tablet by mouth daily.     omeprazole 40 MG capsule  Commonly known as:  PRILOSEC  Take 40 mg by mouth daily.     polyethylene glycol packet  Commonly known as:  MIRALAX / GLYCOLAX  Take 17 g by mouth daily.     predniSONE 10 MG tablet  Commonly known as:  DELTASONE  Takes 6 tablets for  1 days, then 5 tablets for 1 days, then 4 tablets for 1 days, then 3 tablets for 1 days, then 2 tabs for 1 days, then 1 tab for 1 days, and then stop.     valACYclovir 1000 MG tablet  Commonly known as:  VALTREX  Take 1,000 mg by mouth daily as needed (for outbreaks).       No Known Allergies Follow-up Information   Follow up with Leslye Peer., MD In 2 weeks. (hospital follow up)    Specialty:  Pulmonary Disease   Contact information:   [address] [place] [address] [phone]        The results of significant diagnostics from this hospitalization (including imaging, microbiology, ancillary and laboratory) are listed below for reference.    Significant Diagnostic Studies: Dg Chest 2 View  01/04/2014   CLINICAL DATA:  Headache with shortness of breath.  EXAM: CHEST  2 VIEW  COMPARISON:  PA and lateral chest of January 03, 2014  FINDINGS: The lungs are adequately inflated. The perihilar lung markings are prominent. There is no alveolar pneumonia. There is no pulmonary edema or pleural effusion. The cardiac silhouette is normal. The mediastinum is normal in width. There is calcification of the anterior longitudinal ligament of the thoracic spine.  IMPRESSION: Increased perihilar lung markings may reflect acute bronchitis with subsegmental atelectasis. There is no focal pneumonia. Follow-up films are recommended following any therapy to assure clearing.   Electronically Signed   By: David  [place]   On: 01/04/2014 14:39   Dg Chest 2 View  01/03/2014   CLINICAL DATA:  Chest pain with shortness of breath wheezing and dizziness for 1 week; history of asthma  EXAM: CHEST  2 VIEW  COMPARISON:  PA and lateral chest x-ray dated October 27, 2013  FINDINGS: The lungs are adequately inflated. There are increased lung markings in the left infrahilar region anteriorly which likely lie in the lingula the heart and pulmonary vascularity appear normal. There is no pleural effusion. The bony thorax  is unremarkable. With exception of calcification of the anterior longitudinal ligament of the mid thoracic spine.  IMPRESSION: There is subsegmental atelectasis versus early pneumonia in the lingula. Followup films following therapy are recommended to assure clearing.   Electronically Signed   By: David  [place]   On: 01/03/2014 14:48    Microbiology: No results found for this or any previous visit (from the past 240 hour(s)).   Labs: Basic Metabolic Panel:  Recent Labs Lab 01/03/14 1515 01/04/14 1314 01/05/14 0527 01/07/14 1119  NA 141 136* 137 137  K 3.1* 4.7 3.5* 3.8  CL 97 98 98 95*  CO2 GLUCOSE 139* 193* 310* 256*  BUN CREATININE  0.71 0.57 0.69 0.59  CALCIUM 9.8 10.3 10.0 9.6   Liver Function Tests:  Recent Labs Lab 01/04/14 1314  AST 36  ALT 16  ALKPHOS 67  BILITOT 0.6  PROT 8.9*  ALBUMIN 4.2   No results found for this basename: LIPASE, AMYLASE,  in the last 168 hours No results found for this basename: AMMONIA,  in the last 168 hours CBC:  Recent Labs Lab 01/03/14 1515 01/04/14 1314 01/05/14 0527  WBC 11.5* 15.3* 17.0*  NEUTROABS 8.8* 13.1*  --   HGB 13.1 13.4 12.0  HCT 38.7 39.1 36.4  MCV 84.5 84.8 85.4  PLT 395 391 381   Cardiac Enzymes: No results found for this basename: CKTOTAL, CKMB, CKMBINDEX, TROPONINI,  in the last 168 hours BNP: BNP (last 3 results)  Recent Labs  01/04/14 1314  PROBNP 143.4*   CBG: No results found for this basename: GLUCAP,  in the last 168 hours    Signed:  Marinda Elk  [facility] 01/08/2014, 9:41 AM

## 2014-01-12 ENCOUNTER — Telehealth: Payer: Self-pay | Admitting: *Deleted

## 2014-01-12 NOTE — Telephone Encounter (Signed)
May refer to Dr Magnus Ivan to eval for revision TKR

## 2014-01-12 NOTE — Telephone Encounter (Signed)
Angela Hoffman called requesting to get referral to ortho because her legs are hurting her so bad she has to drag them.  She was in for visit with you in July

## 2014-01-25 ENCOUNTER — Inpatient Hospital Stay: Payer: Medicaid Other | Admitting: Emergency Medicine

## 2014-01-26 ENCOUNTER — Ambulatory Visit (INDEPENDENT_AMBULATORY_CARE_PROVIDER_SITE_OTHER): Payer: Medicaid Other | Admitting: Emergency Medicine

## 2014-01-26 ENCOUNTER — Encounter: Payer: Self-pay | Admitting: Emergency Medicine

## 2014-01-26 VITALS — BP 138/66 | HR 94 | Temp 98.1°F | Ht 59.5 in | Wt 166.6 lb

## 2014-01-26 DIAGNOSIS — J45909 Unspecified asthma, uncomplicated: Secondary | ICD-10-CM

## 2014-01-26 NOTE — Progress Notes (Signed)
Subjective:    Patient ID: Angela Hoffman, female    DOB: Aug 27, 1965, 48 y.o.   MRN: [id number]  HPI 48 yo woman, active smoker (5 pk-yrs), hx of HTN and asthma dx in her 69's, PFT's done in [place] . She was admitted for an AE-asthma and bronchitis 8/20-24.  She had SOB, wheeze, cough. She uses albuterol prn. Has been having more trouble since she moved to a new apartment.  She has been sneezing and having PND. Also has GERD with breakthrough sx on omeprazole.    Review of Systems  Constitutional: Negative for fever and unexpected weight change.  HENT: Negative for congestion, dental problem, ear pain, nosebleeds, postnasal drip, rhinorrhea, sinus pressure, sneezing, sore throat and trouble swallowing.   Eyes: Negative for redness and itching.  Respiratory: Negative for cough, chest tightness, shortness of breath and wheezing.   Cardiovascular: Negative for palpitations and leg swelling.  Gastrointestinal: Negative for nausea and vomiting.       Acid heartburn  Genitourinary: Negative for dysuria.  Musculoskeletal: Negative for joint swelling.  Skin: Negative for rash.  Neurological: Negative for headaches.  Hematological: Does not bruise/bleed easily.  Psychiatric/Behavioral: Negative for dysphoric mood. The patient is not nervous/anxious.     Past Medical History  Diagnosis Date  . Asthma   . Hypertension    Family History  Problem Relation Age of Onset  . Kidney failure Mother   . Cirrhosis Father      History   Social History  . Marital Status: Single    Spouse Name: N/A    Number of Children: N/A  . Years of Education: N/A   Occupational History  . disabled    Social History Main Topics  . Smoking status: Current Some Day Smoker    Types: Cigarettes  . Smokeless tobacco: Never Used     Comment: pt states she does not smoke often  . Alcohol Use: No  . Drug Use: No  . Sexual Activity: Yes   Other Topics Concern  . Not on file   Social History  Narrative  . No narrative on file     No Known Allergies   Outpatient Prescriptions Prior to Visit  Medication Sig Dispense Refill  . albuterol (PROVENTIL HFA;VENTOLIN HFA) 108 (90 BASE) MCG/ACT inhaler Inhale 2 puffs into the lungs every 6 (six) hours as needed. For wheezing      . ALPRAZolam (XANAX) 1 MG tablet Take 1 mg by mouth 2 (two) times daily.      Marland Kitchen guaiFENesin-codeine (ROBITUSSIN AC) 100-10 MG/5ML syrup Take 5-10 mLs by mouth 3 (three) times daily as needed for cough.      Marland Kitchen lisinopril-hydrochlorothiazide (PRINZIDE,ZESTORETIC) 20-25 MG per tablet Take 1 tablet by mouth daily.      Marland Kitchen omeprazole (PRILOSEC) 40 MG capsule Take 40 mg by mouth daily.      . polyethylene glycol (MIRALAX / GLYCOLAX) packet Take 17 g by mouth daily.      . valACYclovir (VALTREX) 1000 MG tablet Take 1,000 mg by mouth daily as needed (for outbreaks).       Marland Kitchen azithromycin (ZITHROMAX) 500 MG tablet Take 1 tablet (500 mg total) by mouth daily before supper.  3 tablet  0  . levofloxacin (LEVAQUIN) 500 MG tablet Take 1 tablet (500 mg total) by mouth daily.  3 tablet  0  . predniSONE (DELTASONE) 10 MG tablet Takes 6 tablets for 1 days, then 5 tablets for 1 days, then 4 tablets for 1 days,  then 3 tablets for 1 days, then 2 tabs for 1 days, then 1 tab for 1 days, and then stop.  42 tablet  0   No facility-administered medications prior to visit.         Objective:   Physical Exam Filed Vitals:   01/26/14 1503  BP: 138/66  Pulse: 94  Temp: 98.1 F (36.7 C)  TempSrc: Oral  Height: 4' 11.5" (1.511 m)  Weight: 166 lb 9.6 oz (75.569 kg)  SpO2: 97%   Gen: Pleasant, well-nourished, in no distress,  normal affect  ENT: No lesions,  mouth clear,  oropharynx clear, no postnasal drip  Neck: No JVD, no TMG, no carotid bruits, mild stridor  Lungs: No use of accessory muscles, distant, lfew scattered exp wheezes  Cardiovascular: RRR, heart sounds normal, no murmur or gallops, no peripheral  edema  Musculoskeletal: No deformities, no cyanosis or clubbing  Neuro: alert, non focal  Skin: Warm, no lesions or rashes     Assessment & Plan:  Asthma With recent exacerbation and marginal control.  - will try changing lisinopril to ARB - will add loratadine and increase her PPI to max dose nexium for 1 month - full PFT - continue albuterol for now, suspect she will need a maintenance med in the future.

## 2014-01-26 NOTE — Patient Instructions (Addendum)
Please continue your albuterol as needed for shortness of breath We will change your lisinopril-HCTZ to benicar-HCTZ /20mg  once a day Stop omeprazole. We will start Nexium  twice a day until your next visit Start loratadine  daily We will perform full pulmonary function testing at your next office visit You need to work on stopping your smoking completely.  Follow with Dr Delton Coombes in 1 month with full PFT on the same day.

## 2014-01-26 NOTE — Assessment & Plan Note (Signed)
With recent exacerbation and marginal control.  - will try changing lisinopril to ARB - will add loratadine and increase her PPI to max dose nexium for 1 month - full PFT - continue albuterol for now, suspect she will need a maintenance med in the future.

## 2014-01-30 ENCOUNTER — Telehealth: Payer: Self-pay | Admitting: Emergency Medicine

## 2014-01-30 NOTE — Telephone Encounter (Signed)
LMTCB

## 2014-01-31 NOTE — Telephone Encounter (Signed)
LMTCB

## 2014-02-01 NOTE — Telephone Encounter (Signed)
lmomtcb x 3  

## 2014-02-05 MED ORDER — OLMESARTAN MEDOXOMIL-HCTZ 40-25 MG PO TABS: 1.0000 | ORAL_TABLET | Freq: Every day | ORAL | Status: DC

## 2014-02-05 MED ORDER — ESOMEPRAZOLE MAGNESIUM 40 MG PO CPDR: 40.0000 mg | DELAYED_RELEASE_CAPSULE | Freq: Two times a day (BID) | ORAL | Status: DC

## 2014-02-05 MED ORDER — LORATADINE 10 MG PO TABS: 10.0000 mg | ORAL_TABLET | Freq: Every day | ORAL | Status: DC

## 2014-02-05 MED ORDER — ESOMEPRAZOLE MAGNESIUM 40 MG PO CPDR: 40.0000 mg | DELAYED_RELEASE_CAPSULE | Freq: Two times a day (BID) | ORAL | Status: AC

## 2014-02-05 NOTE — Telephone Encounter (Signed)
Please order for her - thanks.

## 2014-02-05 NOTE — Telephone Encounter (Signed)
All rxs were sent to [facility]

## 2014-02-05 NOTE — Telephone Encounter (Signed)
Per 9/11: Patient Instructions      Please continue your albuterol as needed for shortness of breath We will change your lisinopril-HCTZ to benicar-HCTZ /20mg  once a day Stop omeprazole. We will start Nexium  twice a day until your next visit Start loratadine  daily We will perform full pulmonary function testing at your next office visit You need to work on stopping your smoking completely.  Follow with Dr Delton Coombes in 1 month with full PFT on the same day   Called pt. Her medications were never called in for nexium or her BP medication. The benica HCTZ does not come in 40/20 mg. It does come in 40/25 mg. Please advise RB thanks

## 2014-02-06 ENCOUNTER — Telehealth: Payer: Self-pay | Admitting: Emergency Medicine

## 2014-02-06 NOTE — Telephone Encounter (Signed)
Called and spoke with Angela Hoffman, pharmacist at [facility]  She states that the Benicar HCT 40/25 requires PA  She checked and states that Losartan/HCT is a covered alternative  Please advise if you want to switch meds or call for PA  Thanks! No Known Allergies

## 2014-02-07 ENCOUNTER — Telehealth: Payer: Self-pay | Admitting: *Deleted

## 2014-02-07 MED ORDER — ACETAMINOPHEN-CODEINE #4 300-60 MG PO TABS: 2.0000 | ORAL_TABLET | Freq: Three times a day (TID) | ORAL | Status: DC | PRN

## 2014-02-07 NOTE — Telephone Encounter (Signed)
Patient calling back.  She is needing to know something about bp medication.  As of right now is taking anything for bp. [phone]

## 2014-02-07 NOTE — Telephone Encounter (Signed)
Called pt LMTCB x1 

## 2014-02-07 NOTE — Telephone Encounter (Signed)
Pharmacy does not have the order for the increased disp #180 on the tylenol # 4 and will not fill rx.  New rx called to pharmacy.

## 2014-02-08 NOTE — Telephone Encounter (Signed)
RB please advise. thanks 

## 2014-02-09 MED ORDER — LOSARTAN POTASSIUM-HCTZ 50-12.5 MG PO TABS: 1.0000 | ORAL_TABLET | Freq: Every day | ORAL | Status: AC

## 2014-02-09 NOTE — Telephone Encounter (Signed)
LMTCB x 1 for pt to return call. Called and spoke with [facility]--  Need to know exact dosing? Dr Delton Coombes please advise which dose of Losartan-HCTZ you would like the patient to be on, 50/12.5, 100/12.5, 100/25?

## 2014-02-09 NOTE — Telephone Encounter (Signed)
Losartan - HCTZ 50-12.5mg  qd

## 2014-02-09 NOTE — Telephone Encounter (Signed)
She can do the losartan / HCTZ instead

## 2014-02-09 NOTE — Telephone Encounter (Signed)
RX called in lmtcb for pt

## 2014-02-12 NOTE — Telephone Encounter (Signed)
i have called and lmom to make sure the pt did pick up her new meds.

## 2014-02-12 NOTE — Telephone Encounter (Signed)
Pt aware to pick up rx. Nothing further needed at this time.

## 2014-03-07 ENCOUNTER — Ambulatory Visit: Payer: Medicaid Other | Admitting: Registered Nurse

## 2014-03-07 ENCOUNTER — Other Ambulatory Visit: Payer: Self-pay | Admitting: *Deleted

## 2014-03-07 MED ORDER — ACETAMINOPHEN-CODEINE #4 300-60 MG PO TABS: 2.0000 | ORAL_TABLET | Freq: Three times a day (TID) | ORAL | Status: DC | PRN

## 2014-03-07 NOTE — Telephone Encounter (Signed)
Narcotic rx printed for MD to sign for RN med refill visit 

## 2014-03-08 ENCOUNTER — Encounter: Payer: Medicaid Other | Attending: Physical Medicine & Rehabilitation | Admitting: *Deleted

## 2014-03-08 ENCOUNTER — Encounter: Payer: Medicaid Other | Admitting: Registered Nurse

## 2014-03-08 ENCOUNTER — Other Ambulatory Visit: Payer: Self-pay | Admitting: Physical Medicine & Rehabilitation

## 2014-03-08 VITALS — BP 140/72 | HR 78 | Resp 14 | Ht 69.0 in | Wt 160.0 lb

## 2014-03-08 DIAGNOSIS — Z79899 Other long term (current) drug therapy: Secondary | ICD-10-CM | POA: Diagnosis present

## 2014-03-08 DIAGNOSIS — M25562 Pain in left knee: Secondary | ICD-10-CM | POA: Insufficient documentation

## 2014-03-08 DIAGNOSIS — Z5181 Encounter for therapeutic drug level monitoring: Secondary | ICD-10-CM | POA: Diagnosis present

## 2014-03-09 ENCOUNTER — Ambulatory Visit: Payer: Medicaid Other | Admitting: Emergency Medicine

## 2014-03-09 LAB — PMP ALCOHOL METABOLITE (ETG): Ethyl Glucuronide (EtG): NEGATIVE ng/mL

## 2014-03-09 NOTE — Progress Notes (Unsigned)
Subjective:    Patient ID: Angela Hoffman, female    DOB: 07/05/1965, 48 y.o.   MRN: [id number]  HPI 48 yo woman, active smoker (5 pk-yrs), hx of HTN and asthma dx in her 5020's, [place]FT's done in [place] . She was admitted for an AE-asthma and bronchitis 8/20-24.  She had SOB, wheeze, cough. She uses albuterol prn. Has been having more trouble since she moved to a new apartment.  She has been sneezing and having PND. Also has GERD with breakthrough sx on omeprazole.   ROV 03/09/14 -- follow up visit for dyspnea and wheeze, allergic rhinitis, GERD. Last time we changed lisinopril to ARB, added loratadine and increased nexium.    Review of Systems  Constitutional: Negative for fever and unexpected weight change.  HENT: Negative for congestion, dental problem, ear pain, nosebleeds, postnasal drip, rhinorrhea, sinus pressure, sneezing, sore throat and trouble swallowing.   Eyes: Negative for redness and itching.  Respiratory: Negative for cough, chest tightness, shortness of breath and wheezing.   Cardiovascular: Negative for palpitations and leg swelling.  Gastrointestinal: Negative for nausea and vomiting.       Acid heartburn  Genitourinary: Negative for dysuria.  Musculoskeletal: Negative for joint swelling.  Skin: Negative for rash.  Neurological: Negative for headaches.  Hematological: Does not bruise/bleed easily.  Psychiatric/Behavioral: Negative for dysphoric mood. The patient is not nervous/anxious.     Past Medical History  Diagnosis Date  . Asthma   . Hypertension    Family History  Problem Relation Age of Onset  . Kidney failure Mother   . Cirrhosis Father      History   Social History  . Marital Status: Single    Spouse Name: N/A    Number of Children: N/A  . Years of Education: N/A   Occupational History  . disabled    Social History Main Topics  . Smoking status: Current Some Day Smoker    Types: Cigarettes  . Smokeless tobacco: Never Used   Comment: pt states she does not smoke often  . Alcohol Use: No  . Drug Use: No  . Sexual Activity: Yes   Other Topics Concern  . Not on file   Social History Narrative  . No narrative on file     No Known Allergies   Outpatient Prescriptions Prior to Visit  Medication Sig Dispense Refill  . acetaminophen-codeine (TYLENOL #4) 300-60 MG per tablet Take 2 tablets by mouth every 8 (eight) hours as needed for moderate pain.  180 tablet  0  . albuterol (PROVENTIL HFA;VENTOLIN HFA) 108 (90 BASE) MCG/ACT inhaler Inhale 2 puffs into the lungs every 6 (six) hours as needed. For wheezing      . ALPRAZolam (XANAX) 1 MG tablet Take 1 mg by mouth 2 (two) times daily.      Marland Kitchen. esomeprazole (NEXIUM) 40 MG capsule Take 1 capsule (40 mg total) by mouth 2 (two) times daily.  60 capsule  6  . guaiFENesin-codeine (ROBITUSSIN AC) 100-10 MG/5ML syrup Take 5-10 mLs by mouth 3 (three) times daily as needed for cough.      . loratadine (CLARITIN) 10 MG tablet Take 1 tablet (10 mg total) by mouth daily.  30 tablet  6  . losartan-hydrochlorothiazide (HYZAAR) 50-12.5 MG per tablet Take 1 tablet by mouth daily.  30 tablet  3  . olmesartan-hydrochlorothiazide (BENICAR HCT) 40-25 MG per tablet Take 1 tablet by mouth daily.  30 tablet  6  . polyethylene glycol (MIRALAX / GLYCOLAX) packet  Take 17 g by mouth daily.      . valACYclovir (VALTREX) 1000 MG tablet Take 1,000 mg by mouth daily as needed (for outbreaks).        No facility-administered medications prior to visit.         Objective:   Physical Exam There were no vitals filed for this visit. Gen: Pleasant, well-nourished, in no distress,  normal affect  ENT: No lesions,  mouth clear,  oropharynx clear, no postnasal drip  Neck: No JVD, no TMG, no carotid bruits, mild stridor  Lungs: No use of accessory muscles, distant, lfew scattered exp wheezes  Cardiovascular: RRR, heart sounds normal, no murmur or gallops, no peripheral edema  Musculoskeletal: No  deformities, no cyanosis or clubbing  Neuro: alert, non focal  Skin: Warm, no lesions or rashes     Assessment & Plan:  No problem-specific assessment & plan notes found for this encounter.

## 2014-03-12 NOTE — Progress Notes (Signed)
Here for pill count and medication refills. Fill date    Today NV#   VS    Pain level: 

## 2014-03-13 LAB — OPIATES/OPIOIDS (LC/MS-MS)
CODEINE URINE: 27116 ng/mL — AB (ref <50)
HYDROCODONE: NEGATIVE ng/mL (ref <50)
HYDROMORPHONE: NEGATIVE ng/mL (ref <50)
Morphine Urine: NEGATIVE ng/mL (ref <50)
Norhydrocodone, Ur: NEGATIVE ng/mL (ref <50)
Noroxycodone, Ur: NEGATIVE ng/mL (ref <50)
Oxycodone, ur: NEGATIVE ng/mL (ref <50)
Oxymorphone: NEGATIVE ng/mL (ref <50)

## 2014-03-14 LAB — PRESCRIPTION MONITORING PROFILE (SOLSTAS)
Amphetamine/Meth: NEGATIVE ng/mL
Barbiturate Screen, Urine: NEGATIVE ng/mL
Benzodiazepine Screen, Urine: NEGATIVE ng/mL
Buprenorphine, Urine: NEGATIVE ng/mL
CARISOPRODOL, URINE: NEGATIVE ng/mL
CREATININE, URINE: 181.12 mg/dL (ref >20.0)
Cannabinoid Scrn, Ur: NEGATIVE ng/mL
Cocaine Metabolites: NEGATIVE ng/mL
ECSTASY: NEGATIVE ng/mL
Fentanyl, Ur: NEGATIVE ng/mL
Meperidine, Ur: NEGATIVE ng/mL
Methadone Screen, Urine: NEGATIVE ng/mL
Nitrites, Initial: NEGATIVE ug/mL
OXYCODONE SCRN UR: NEGATIVE ng/mL
PH URINE, INITIAL: 6.8 pH (ref 4.5–8.9)
Propoxyphene: NEGATIVE ng/mL
TRAMADOL UR: NEGATIVE ng/mL
Tapentadol, urine: NEGATIVE ng/mL
Zolpidem, Urine: NEGATIVE ng/mL

## 2014-03-26 ENCOUNTER — Telehealth: Payer: Self-pay | Admitting: *Deleted

## 2014-03-26 NOTE — Telephone Encounter (Signed)
Lvm on pt's cell at [phone] to please call our office to set up an appointment

## 2014-03-26 NOTE — Telephone Encounter (Signed)
Pt called, recent surgery, she says she has a note from her surgeon to get medications from her pain doctor, states that Tylenol 4's are not working, needs a call to discuss pain med options

## 2014-03-26 NOTE — Telephone Encounter (Signed)
Medication changes need to be made at appointments, Please make an appointment for this patient tomorrow with Kirsteins or [facility]. Thank you

## 2014-03-27 NOTE — Telephone Encounter (Signed)
[facility]. JCS

## 2014-03-28 ENCOUNTER — Encounter: Payer: Self-pay | Admitting: Registered Nurse

## 2014-03-28 ENCOUNTER — Encounter: Payer: Medicaid Other | Attending: Physical Medicine & Rehabilitation | Admitting: Registered Nurse

## 2014-03-28 VITALS — BP 162/87 | HR 91 | Resp 14 | Ht 59.0 in | Wt 174.0 lb

## 2014-03-28 DIAGNOSIS — Z5181 Encounter for therapeutic drug level monitoring: Secondary | ICD-10-CM | POA: Insufficient documentation

## 2014-03-28 DIAGNOSIS — Z79899 Other long term (current) drug therapy: Secondary | ICD-10-CM

## 2014-03-28 DIAGNOSIS — M549 Dorsalgia, unspecified: Secondary | ICD-10-CM

## 2014-03-28 DIAGNOSIS — M25562 Pain in left knee: Secondary | ICD-10-CM | POA: Diagnosis present

## 2014-03-28 DIAGNOSIS — G8929 Other chronic pain: Secondary | ICD-10-CM

## 2014-03-28 MED ORDER — ACETAMINOPHEN-CODEINE #4 300-60 MG PO TABS: 2.0000 | ORAL_TABLET | Freq: Three times a day (TID) | ORAL | Status: DC | PRN

## 2014-03-28 MED ORDER — OXYCODONE-ACETAMINOPHEN 5-325 MG PO TABS: 1.0000 | ORAL_TABLET | Freq: Four times a day (QID) | ORAL | Status: DC | PRN

## 2014-03-28 NOTE — Progress Notes (Signed)
Subjective:    Patient ID: Angela Hoffman, female    DOB: 12/06/1965, 48 y.o.   MRN: [id number]  HPI: Ms. Angela LaniusMelinda Hoffman is a 48 year old female who returns for follow up for chronic pain and medication refill. Her current exercise regime walking short distances. Dr. Thalia BloodgoodNeuman has ordered physical therapy. She is waiting a call back from therapy.  She was calling the office due to increase intensity of pain, she had left knee arthroscopic on 03/12/2014 via Dr. Thalia [facility]goodNeuman from [facility]. During her post op phase she was prescribed Percocet 5/325 1-2 tablets every 6 hour for five days. She called Dr. Thalia BloodgoodNeuman office they told her they won't prescribe pain medication since she has a contract with this office. She brought the letter with her today. She was on tylenol #4 with no relief. Was in contact with Dr. Wynn BankerKirsteins we will prescribe percocet 5/325mg  . She verbalizes understanding. Past Medical History: osteoarthritis in both knees. 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 Continues to have postoperative pain. [place] [facility] [place] [facility].   Pain Inventory Average Pain 9 Pain Right Now 10 My pain is constant and sharp  In the last 24 hours, has pain interfered with the following? General activity 4 Relation with others 4 Enjoyment of life 4 What TIME of day is your pain at its worst? morning,night Sleep (in general) Fair  Pain is worse with: walking, bending, sitting and standing Pain improves with: heat/ice and medication Relief from Meds: 2  Mobility use a cane how many minutes can you walk? very little ability to climb steps?  yes do you drive?  no  Function disabled: date disabled unsure  Neuro/Psych trouble walking  Prior Studies Any changes since last visit?  no  Physicians involved in your care Any changes since last visit?  no   Family History  Problem Relation Age of Onset  .  Kidney failure Mother   . Cirrhosis Father    History   Social History  . Marital Status: Single    Spouse Name: N/A    Number of Children: N/A  . Years of Education: N/A   Occupational History  . disabled    Social History Main Topics  . Smoking status: Current Some Day Smoker    Types: Cigarettes  . Smokeless tobacco: Never Used     Comment: pt states she does not smoke often  . Alcohol Use: No  . Drug Use: No  . Sexual Activity: Yes   Other Topics Concern  . None   Social History Narrative   Past Surgical History  Procedure Laterality Date  . Cholecystectomy    . Tonsillectomy    . Tubal ligation    . Ankle surgery Left   . Tubal ligation    . Ankle surgery Left   . Replacement total knee bilateral Bilateral   . Achilles tendon repair Right    Past Medical History  Diagnosis Date  . Asthma   . Hypertension    BP 162/87 mmHg  Pulse 91  Resp 14  Ht 4\' 11"  (1.499 m)  Wt 174 lb (78.926 kg)  BMI 35.13 kg/m2  SpO2 95%  Opioid Risk Score:   Fall Risk Score: Low Fall Risk (0-5 points)  Review of Systems  Constitutional: Positive for unexpected weight change.  Musculoskeletal: Positive for joint swelling and arthralgias.  All other systems reviewed and are negative.  Objective:   Physical Exam  Constitutional: She is oriented to person, place, and time. She appears well-developed and well-nourished.  HENT:  Head: Normocephalic and atraumatic.  Neck: Normal range of motion. Neck supple.  Cardiovascular: Normal rate and regular rhythm.   Pulmonary/Chest: Effort normal. She has wheezes.  Musculoskeletal:  Normal Muscle Bulk and Muscle Testing Reveals: Upper Extremities: Decreased  ROM 90  Degrees and Muscle Strength 5/5 Lower Extremities: Right Full ROM and Muscle Strength 5/5. Right Flexion Produces Pain into Patella Left with Decreased ROM and Flexion produces pain into patella. Muscle Strength 3/5 Arises from chair with slight difficulty/ using  straight cane for support Antalgic Gait   Neurological: She is alert and oriented to person, place, and time.  Skin: Skin is warm and dry.  Psychiatric: She has a normal mood and affect.  Nursing note and vitals reviewed.         Assessment & Plan:  1. 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 S/P Left Knee Arthroscopic on 03/12/2014 via Dr. Thalia BloodgoodNeuman Tylenol #4 Discontinued: RX: Percocet 5/325mg  one tablet every 6 hours as needed #120. 2. Chronic low back pain no radicular signs.Continue with Heat and exercise regime.  30 minutes of face to face patient care time was spent during this visit. All questions were encouraged and answered.  F/U in 1 month

## 2014-04-24 ENCOUNTER — Encounter: Payer: Self-pay | Admitting: Registered Nurse

## 2014-04-24 ENCOUNTER — Encounter: Payer: Medicaid Other | Attending: Physical Medicine & Rehabilitation | Admitting: Registered Nurse

## 2014-04-24 VITALS — BP 161/86 | HR 104 | Resp 14 | Wt 172.6 lb

## 2014-04-24 DIAGNOSIS — M25562 Pain in left knee: Secondary | ICD-10-CM | POA: Diagnosis not present

## 2014-04-24 DIAGNOSIS — Z5181 Encounter for therapeutic drug level monitoring: Secondary | ICD-10-CM | POA: Diagnosis present

## 2014-04-24 DIAGNOSIS — Z79899 Other long term (current) drug therapy: Secondary | ICD-10-CM | POA: Diagnosis present

## 2014-04-24 MED ORDER — OXYCODONE-ACETAMINOPHEN 5-325 MG PO TABS: 1.0000 | ORAL_TABLET | Freq: Four times a day (QID) | ORAL | Status: DC | PRN

## 2014-04-24 NOTE — Progress Notes (Signed)
Subjective:    Patient ID: Angela Hoffman, female    DOB: 12/18/1965, 48 y.o.   MRN: [id number]  HPI: Ms. Angela LaniusMelinda Hoffman is a 48 year old female who returns for follow up for chronic pain and medication refill. She says her pain is located in her left knee. She rates her pain 8. Her current exercise regime is walking short distances.  She arrived to office tachycardic HR 104 and re-checked 88. Ms. Angela Hoffman fell 11/2 weeks ago she was walking outside  And her left foot gave out and she landed on the grass. She didn't have her cane. She was able to pull herself up, didn't seek medical attention. She was instructed to use her cane at all times, she verbalizes understanding. Educated on [employer] Prevention and she verbalizes understanding. She's out of her medications two days early, she admits taking more medication after the fall. Educated on compliance and self medicating. Instructed to call office if she experiences increase intensity of pain she verbalizes understanding. Will allow early prescription fill today, and she is aware her pill count must be accurate next month, she verbalizes understanding.  Pain Inventory Average Pain 8 Pain Right Now 8 My pain is aching  In the last 24 hours, has pain interfered with the following? General activity 7 Relation with others 7 Enjoyment of life 7 What TIME of day is your pain at its worst? daytime Sleep (in general) Poor  Pain is worse with: walking and standing Pain improves with: medication Relief from Meds: na  Mobility use a cane how many minutes can you walk? 5  Function disabled: date disabled .  Neuro/Psych dizziness  Prior Studies Any changes since last visit?  no  Physicians involved in your care Any changes since last visit?  no   Family History  Problem Relation Age of Onset  . Kidney failure Mother   . Cirrhosis Father    History   Social History  . Marital Status: Single    Spouse Name: N/A    Number of  Children: N/A  . Years of Education: N/A   Occupational History  . disabled    Social History Main Topics  . Smoking status: Current Some Day Smoker    Types: Cigarettes  . Smokeless tobacco: Never Used     Comment: pt states she does not smoke often  . Alcohol Use: No  . Drug Use: No  . Sexual Activity: Yes   Other Topics Concern  . None   Social History Narrative   Past Surgical History  Procedure Laterality Date  . Cholecystectomy    . Tonsillectomy    . Tubal ligation    . Ankle surgery Left   . Tubal ligation    . Ankle surgery Left   . Replacement total knee bilateral Bilateral   . Achilles tendon repair Right    Past Medical History  Diagnosis Date  . Asthma   . Hypertension    BP 161/86 mmHg  Pulse 104  Resp 14  Wt 172 lb 9.6 oz (78.291 kg)  SpO2 97%  Opioid Risk Score:   Fall Risk Score: Moderate Fall Risk (6-13 points) (previoulsy educated and given handout)  Review of Systems  Neurological: Positive for dizziness.  All other systems reviewed and are negative.      Objective:   Physical Exam  Constitutional: She is oriented to person, place, and time. She appears well-developed and well-nourished.  HENT:  Head: Normocephalic and atraumatic.  Neck: Normal range  of motion. Neck supple.  Cardiovascular: Normal rate and regular rhythm.   Pulmonary/Chest: Effort normal and breath sounds normal.  Musculoskeletal:  Normal Muscle Bulk and Muscle testing Reveals: Upper Extremities: Full ROM and Muscle strength 5/5 Lower Extremities: Full ROM and Muscle strength 5/5 Left Leg Flexion Produces Pain into Patella Arises from chair with ease/ Using Straight Cane for support Narrow Based Gait   Neurological: She is alert and oriented to person, place, and time.  Skin: Skin is warm and dry.  Psychiatric: She has a normal mood and affect.  Nursing note and vitals reviewed.         Assessment & Plan:  1. 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 S/P Left Knee Arthroscopic on 03/12/2014 via Dr. Thalia BloodgoodNeuman Refilled: Percocet 5/325mg  one tablet every 6 hours as needed #120. 2. Chronic low back pain no radicular signs.Continue with Heat and exercise regime.  20 minutes of face to face patient care time was spent during this visit. All questions were encouraged and answered.  F/U in 1 month

## 2014-05-08 ENCOUNTER — Encounter: Payer: Self-pay | Admitting: Endocrinology

## 2014-05-21 ENCOUNTER — Encounter: Payer: Self-pay | Admitting: Registered Nurse

## 2014-05-21 ENCOUNTER — Encounter: Payer: Medicaid Other | Attending: Physical Medicine & Rehabilitation | Admitting: Registered Nurse

## 2014-05-21 VITALS — BP 167/87 | HR 98 | Resp 14

## 2014-05-21 DIAGNOSIS — M25562 Pain in left knee: Secondary | ICD-10-CM | POA: Diagnosis present

## 2014-05-21 DIAGNOSIS — Z5181 Encounter for therapeutic drug level monitoring: Secondary | ICD-10-CM | POA: Insufficient documentation

## 2014-05-21 DIAGNOSIS — Z79899 Other long term (current) drug therapy: Secondary | ICD-10-CM | POA: Diagnosis present

## 2014-05-21 MED ORDER — OXYCODONE-ACETAMINOPHEN 5-325 MG PO TABS: 1.0000 | ORAL_TABLET | Freq: Four times a day (QID) | ORAL | Status: DC | PRN

## 2014-05-21 NOTE — Progress Notes (Signed)
Subjective:    Patient ID: Angela Hoffman, female    DOB: 09/20/1965, 49 y.o.   MRN: [id number]  HPI: Ms. Marlyss Cissell is a 49 year old female who returns for follow up for chronic pain and medication refill. She says her pain is located in her left hand and left knee. She rates her pain 7. Her current exercise regime is walking short distances.  She will be following with Dr. Thalia Bloodgood regarding her left hand she had carpal tunnel release surgery. She arrived hypertensive 167/87 blood pressure re-checked 135/64.  Pain Inventory Average Pain 8 Pain Right Now 7 My pain is stabbing  In the last 24 hours, has pain interfered with the following? General activity 7 Relation with others 7 Enjoyment of life 7 What TIME of day is your pain at its worst? daytime,night Sleep (in general) Fair  Pain is worse with: bending Pain improves with: medication Relief from Meds: 5  Mobility walk with assistance use a cane ability to climb steps?  yes do you drive?  no  Function disabled: date disabled . I need assistance with the following:  dressing  Neuro/Psych spasms  Prior Studies Any changes since last visit?  no  Physicians involved in your care Any changes since last visit?  no   Family History  Problem Relation Age of Onset  . Kidney failure Mother   . Cirrhosis Father    History   Social History  . Marital Status: Single    Spouse Name: N/A    Number of Children: N/A  . Years of Education: N/A   Occupational History  . disabled    Social History Main Topics  . Smoking status: Current Some Day Smoker    Types: Cigarettes  . Smokeless tobacco: Never Used     Comment: pt states she does not smoke often  . Alcohol Use: No  . Drug Use: No  . Sexual Activity: Yes   Other Topics Concern  . None   Social History Narrative   Past Surgical History  Procedure Laterality Date  . Cholecystectomy    . Tonsillectomy    . Tubal ligation    . Ankle surgery Left     . Tubal ligation    . Ankle surgery Left   . Replacement total knee bilateral Bilateral   . Achilles tendon repair Right    Past Medical History  Diagnosis Date  . Asthma   . Hypertension    BP 167/87 mmHg  Pulse 98  Resp 14  SpO2 95%  Opioid Risk Score:   Fall Risk Score: Moderate Fall Risk (6-13 points) Review of Systems  Neurological:       Spasms  All other systems reviewed and are negative.      Objective:   Physical Exam  Constitutional: She is oriented to person, place, and time. She appears well-developed and well-nourished.  HENT:  Head: Normocephalic and atraumatic.  Neck: Normal range of motion. Neck supple.  Cardiovascular: Normal rate and regular rhythm.   Pulmonary/Chest: Effort normal and breath sounds normal.  Musculoskeletal:  Normal Muscle Bulk and Muscle Testing Reveals: Upper Extremities: Full ROM and Muscle Strength 4/5 Back without Spinal or Paraspinal Tenderness Lower Extremities: Full ROM and Muscle Strength 5/5 Left Leg Flexion Produces pain into Patella Arises from chair with slight difficulty Antalgic Gait  Neurological: She is alert and oriented to person, place, and time.  Skin: Skin is warm and dry.  Psychiatric: She has a normal mood and affect.  Nursing note and vitals reviewed.         Assessment & Plan:  1. 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 S/P Left Knee Arthroscopic on 03/12/2014 via Dr. Thalia Bloodgood Refilled: Percocet 5/325mg  one tablet every 6 hours as needed #120. 2. Chronic low back pain no radicular signs.Continue with Heat and exercise regime.  20 minutes of face to face patient care time was spent during this visit. All questions were encouraged and answered.  F/U in 1 month

## 2014-06-05 ENCOUNTER — Ambulatory Visit: Payer: Medicaid Other | Admitting: Physical Medicine & Rehabilitation

## 2014-06-21 ENCOUNTER — Encounter: Payer: Medicaid Other | Attending: Physical Medicine & Rehabilitation

## 2014-06-21 ENCOUNTER — Encounter: Payer: Self-pay | Admitting: Physical Medicine & Rehabilitation

## 2014-06-21 ENCOUNTER — Ambulatory Visit (HOSPITAL_BASED_OUTPATIENT_CLINIC_OR_DEPARTMENT_OTHER): Payer: Medicaid Other | Admitting: Physical Medicine & Rehabilitation

## 2014-06-21 VITALS — BP 148/84 | HR 100 | Resp 16

## 2014-06-21 DIAGNOSIS — M24561 Contracture, right knee: Secondary | ICD-10-CM

## 2014-06-21 DIAGNOSIS — M24562 Contracture, left knee: Secondary | ICD-10-CM

## 2014-06-21 DIAGNOSIS — G8929 Other chronic pain: Secondary | ICD-10-CM

## 2014-06-21 DIAGNOSIS — M549 Dorsalgia, unspecified: Secondary | ICD-10-CM

## 2014-06-21 DIAGNOSIS — Z79899 Other long term (current) drug therapy: Secondary | ICD-10-CM | POA: Insufficient documentation

## 2014-06-21 DIAGNOSIS — M25562 Pain in left knee: Secondary | ICD-10-CM | POA: Diagnosis not present

## 2014-06-21 DIAGNOSIS — M47816 Spondylosis without myelopathy or radiculopathy, lumbar region: Secondary | ICD-10-CM

## 2014-06-21 DIAGNOSIS — Z5181 Encounter for therapeutic drug level monitoring: Secondary | ICD-10-CM | POA: Insufficient documentation

## 2014-06-21 DIAGNOSIS — G8928 Other chronic postprocedural pain: Secondary | ICD-10-CM

## 2014-06-21 MED ORDER — DIAZEPAM 5 MG PO TABS: 5.0000 mg | ORAL_TABLET | Freq: Once | ORAL | Status: DC

## 2014-06-21 MED ORDER — OXYCODONE-ACETAMINOPHEN 5-325 MG PO TABS: 1.0000 | ORAL_TABLET | Freq: Four times a day (QID) | ORAL | Status: DC | PRN

## 2014-06-21 NOTE — Patient Instructions (Signed)
Please call if you decide to schedule the lumbar medial branch blocks. We would need to call in some Valium 10 mg tablet that can be taken prior to the injection

## 2014-06-21 NOTE — Progress Notes (Signed)
Subjective:    Patient ID: Angela Hoffman, female    DOB: 01/18/1966, 49 y.o.   MRN: [id number] Patient with osteoarthritis in both knees. 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 Continues to have postoperative pain. [facility] Patient had about 4 therapy visits postoperatively  Patient is on gabapentin 300 mg which are somewhat helpful for knee pain  Continues take Motrin 800 mg as needed not everyday   HPI CC Low back pain  Lumbar x-rays from 10/30/2013 demonstrated left greater than right L5-S1 and L4-L5 facet arthropathy, disc spaces are well maintained on lateral imaging Pain Inventory Average Pain 8 Pain Right Now 9 My pain is constant, burning and aching  In the last 24 hours, has pain interfered with the following? General activity 10 Relation with others 4 Enjoyment of life 4  What TIME of day is your pain at its worst? daytime and night Sleep (in general) Poor  Pain is worse with: walking, bending, standing and some activites Pain improves with: medication Relief from Meds: 4  Mobility walk without assistance use a cane how many minutes can you walk? 10 ability to climb steps?  yes do you drive?  no  Function disabled: date disabled .  Neuro/Psych trouble walking  Prior Studies Any changes since last visit?  no  Physicians involved in your care Any changes since last visit?  no   Family History  Problem Relation Age of Onset  . Kidney failure Mother   . Cirrhosis Father    History   Social History  . Marital Status: Single    Spouse Name: N/A    Number of Children: N/A  . Years of Education: N/A   Occupational History  . disabled    Social History Main Topics  . Smoking status: Current Some Day Smoker    Types: Cigarettes  . Smokeless tobacco: Never Used     Comment: pt states she does not smoke often  . Alcohol Use: No  . Drug Use: No    . Sexual Activity: Yes   Other Topics Concern  . None   Social History Narrative   Past Surgical History  Procedure Laterality Date  . Cholecystectomy    . Tonsillectomy    . Tubal ligation    . Ankle surgery Left   . Tubal ligation    . Ankle surgery Left   . Replacement total knee bilateral Bilateral   . Achilles tendon repair Right    Past Medical History  Diagnosis Date  . Asthma   . Hypertension    BP 148/84 mmHg  Pulse 100  Resp 16  SpO2 96%  Opioid Risk Score:   Fall Risk Score: Moderate Fall Risk (6-13 points)  Review of Systems  Constitutional: Negative.   HENT: Negative.   Eyes: Negative.   Cardiovascular: Negative.   Gastrointestinal: Negative.   Endocrine: Negative.   Genitourinary: Negative.   Musculoskeletal: Positive for myalgias, back pain and arthralgias.  Skin: Negative.   Allergic/Immunologic: Negative.   Neurological:       Trouble walking  Hematological: Negative.   Psychiatric/Behavioral: Negative.        Objective:   Physical Exam  Constitutional: She is oriented to person, place, and time. She appears well-developed and well-nourished.  Neurological: She is alert and oriented to person, place, and time.  Nursing note and vitals reviewed.  Lumbar areas tenderness to palpation L4-5  L5-S1 paraspinal area. Has pain with forward flexion but good range of motion  50-75%. Extension Limited to neutral  Right knee flexion 120, extension -20 Left knee flexion to 90 extension to -30        Assessment & Plan:  1. Chronic lumbar pain. Imaging studies and exam consistent with lumbar spondylosis. Has had good relief with lumbar injections in the past at another clinic although she doesn't remember exactly what type of injection she received Recommend bilateral L3 L4 L5 medial branch blocks. She has anxiety regarding the procedure and will be given Valium 5 mg one to 2 tablets prior to the procedure.  2. 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 Continue oxycodone 5 mg 4 times a day Continue opioid monitoring program. This consists of regular clinic visits, examinations, urine drug screen, pill counts as well as use of [place] controlled substance reporting System.

## 2014-06-22 ENCOUNTER — Ambulatory Visit: Payer: Medicaid Other | Admitting: Physical Medicine & Rehabilitation

## 2014-07-13 ENCOUNTER — Telehealth: Payer: Self-pay | Admitting: *Deleted

## 2014-07-13 NOTE — Telephone Encounter (Signed)
noted 

## 2014-07-13 NOTE — Telephone Encounter (Signed)
Angela AlcideMelinda called to report she is having to have dental work Monday and they will be giving her a few pills of hydrocodone.  She is at the end of her prescription for percocet (06/21/14)and has appt with us 07/19/14.  She just wanted to let us know.

## 2014-07-19 ENCOUNTER — Other Ambulatory Visit: Payer: Self-pay | Admitting: Physical Medicine & Rehabilitation

## 2014-07-19 ENCOUNTER — Ambulatory Visit (HOSPITAL_BASED_OUTPATIENT_CLINIC_OR_DEPARTMENT_OTHER): Payer: Medicaid Other | Admitting: Physical Medicine & Rehabilitation

## 2014-07-19 ENCOUNTER — Encounter: Payer: Medicaid Other | Attending: Physical Medicine & Rehabilitation

## 2014-07-19 ENCOUNTER — Encounter: Payer: Self-pay | Admitting: Physical Medicine & Rehabilitation

## 2014-07-19 VITALS — BP 140/71 | HR 109 | Resp 16

## 2014-07-19 DIAGNOSIS — Z79899 Other long term (current) drug therapy: Secondary | ICD-10-CM

## 2014-07-19 DIAGNOSIS — M47816 Spondylosis without myelopathy or radiculopathy, lumbar region: Secondary | ICD-10-CM | POA: Diagnosis not present

## 2014-07-19 DIAGNOSIS — M549 Dorsalgia, unspecified: Secondary | ICD-10-CM

## 2014-07-19 DIAGNOSIS — Z5181 Encounter for therapeutic drug level monitoring: Secondary | ICD-10-CM | POA: Diagnosis present

## 2014-07-19 DIAGNOSIS — M25562 Pain in left knee: Secondary | ICD-10-CM | POA: Insufficient documentation

## 2014-07-19 DIAGNOSIS — G8929 Other chronic pain: Secondary | ICD-10-CM

## 2014-07-19 MED ORDER — OXYCODONE-ACETAMINOPHEN 5-325 MG PO TABS: 1.0000 | ORAL_TABLET | Freq: Four times a day (QID) | ORAL | Status: DC | PRN

## 2014-07-19 NOTE — Progress Notes (Signed)
Bilateral Lumbar L3, L4  medial branch blocks and L 5 dorsal ramus injection under fluoroscopic guidance  Indication: Lumbar pain which is not relieved by medication management or other conservative care and interfering with self-care and mobility.  Informed consent was obtained after describing risks and benefits of the procedure with the patient, this includes bleeding, infection, paralysis and medication side effects.  The patient wishes to proceed and has given written consent.  The patient was placed in prone position.  The lumbar area was marked and prepped with Betadine.  One mL of 1% lidocaine was injected into each of 6 areas into the skin and subcutaneous tissue.  Then a 22-gauge 3.5 in spinal needle was inserted targeting the junction of the left S1 superior articular process and sacral ala junction. Needle was advanced under fluoroscopic guidance.  Bone contact was made.  Omnipaque 180 was injected x 0.5 mL demonstrating no intravascular uptake.  Then a solution containing one mL of 4 mg per mL dexamethasone and 3 mL of 2% MPF lidocaine was injected x 0.5 mL.  Then the left L5 superior articular process in transverse process junction was targeted.  Bone contact was made.  Omnipaque 180 was injected x 0.5 mL demonstrating no intravascular uptake. Then a solution containing one mL of 4 mg per mL dexamethasone and 3 mL of 2% MPF lidocaine was injected x 0.5 mL.  Then the left L4 superior articular process in transverse process junction was targeted.  Bone contact was made.  Omnipaque 180 was injected x 0.5 mL demonstrating no intravascular uptake.  Then a solution containing one mL of 4 mg per mL dexamethasone and 3 mL if 2% MPF lidocaine was injected x 0.5 mL.  This same procedure was performed on the right side using the same needle, technique and injectate.  Patient tolerated procedure well.  Post procedure instructions were given. 

## 2014-07-19 NOTE — Progress Notes (Signed)
  PROCEDURE RECORD [facility]   Name: Angela LaniusMelinda Hoffman DOB:07/11/1965 MRN: [id number]  Date:07/19/2014  Physician: Claudette LawsAndrew Kirsteins, MD    Nurse/CMA: Wessling CMA/ Education administratorhumaker RN  Allergies: No Known Allergies  Consent Signed: Yes.    Is patient diabetic? No.  CBG today?   Pregnant: No. LMP: No LMP recorded. Patient has had a hysterectomy. (age 49-55)  Anticoagulants: no Anti-inflammatory: no Antibiotics: no  Procedure: Bilateral L3-4-5 Medial Branch Blocks Position: Prone Start Time:11:40am  End Time:11:52 am  Fluoro Time: 42  RN/CMA Scientist, forensichumaker RN Wessling CMA    Time 11:08 11:55am    BP 140/71 164/67    Pulse 109 104    Respirations 16 16    O2 Sat 90% 97    S/S 6 6    Pain Level 8/10 0/10     D/C home with Apolinar JunesBrandon, patient A & O X 3, D/C instructions reviewed, and sits independently.

## 2014-07-20 LAB — PRESCRIPTION MONITORING PROFILE (SOLSTAS)
Amphetamine/Meth: NEGATIVE ng/mL
BUPRENORPHINE, URINE: NEGATIVE ng/mL
Barbiturate Screen, Urine: NEGATIVE ng/mL
Benzodiazepine Screen, Urine: NEGATIVE ng/mL
CARISOPRODOL, URINE: NEGATIVE ng/mL
Cannabinoid Scrn, Ur: NEGATIVE ng/mL
Cocaine Metabolites: NEGATIVE ng/mL
Creatinine, Urine: 51.4 mg/dL (ref >20.0)
ECSTASY: NEGATIVE ng/mL
FENTANYL URINE: NEGATIVE ng/mL
MEPERIDINE UR: NEGATIVE ng/mL
METHADONE SCREEN, URINE: NEGATIVE ng/mL
Nitrites, Initial: NEGATIVE ug/mL
Opiate Screen, Urine: NEGATIVE ng/mL
Oxycodone Screen, Ur: NEGATIVE ng/mL
PH URINE, INITIAL: 6.6 pH (ref 4.5–8.9)
PROPOXYPHENE: NEGATIVE ng/mL
Tapentadol, urine: NEGATIVE ng/mL
Tramadol Scrn, Ur: NEGATIVE ng/mL
Zolpidem, Urine: NEGATIVE ng/mL

## 2014-07-20 LAB — PMP ALCOHOL METABOLITE (ETG): Ethyl Glucuronide (EtG): NEGATIVE ng/mL

## 2014-08-03 NOTE — Progress Notes (Signed)
Urine drug screen for this encounter is negative for prescribed medication oxycodone. Her pill count on this date for oxycodone was 0.  She had had dental work and brought in the hydrocodone and turned in #14 of the 20 to be destroyed. Unclear if this is inconsistent. Has not had previous inconsistencies.

## 2014-08-24 ENCOUNTER — Encounter: Payer: Self-pay | Admitting: Physical Medicine & Rehabilitation

## 2014-08-24 ENCOUNTER — Ambulatory Visit (HOSPITAL_BASED_OUTPATIENT_CLINIC_OR_DEPARTMENT_OTHER): Payer: Medicaid Other | Admitting: Physical Medicine & Rehabilitation

## 2014-08-24 ENCOUNTER — Encounter: Payer: Medicaid Other | Attending: Physical Medicine & Rehabilitation

## 2014-08-24 VITALS — BP 138/90 | HR 86 | Resp 14

## 2014-08-24 DIAGNOSIS — M24561 Contracture, right knee: Secondary | ICD-10-CM

## 2014-08-24 DIAGNOSIS — Z5181 Encounter for therapeutic drug level monitoring: Secondary | ICD-10-CM | POA: Insufficient documentation

## 2014-08-24 DIAGNOSIS — Z79899 Other long term (current) drug therapy: Secondary | ICD-10-CM | POA: Insufficient documentation

## 2014-08-24 DIAGNOSIS — M25562 Pain in left knee: Secondary | ICD-10-CM | POA: Insufficient documentation

## 2014-08-24 DIAGNOSIS — G8928 Other chronic postprocedural pain: Secondary | ICD-10-CM

## 2014-08-24 DIAGNOSIS — M24562 Contracture, left knee: Secondary | ICD-10-CM

## 2014-08-24 DIAGNOSIS — M47816 Spondylosis without myelopathy or radiculopathy, lumbar region: Secondary | ICD-10-CM | POA: Diagnosis not present

## 2014-08-24 MED ORDER — OXYCODONE-ACETAMINOPHEN 5-325 MG PO TABS: 1.0000 | ORAL_TABLET | Freq: Four times a day (QID) | ORAL | Status: DC | PRN

## 2014-08-24 NOTE — Patient Instructions (Signed)
Medial branch blocks next visit  We also discussed freezing nerves to the knees

## 2014-08-24 NOTE — Progress Notes (Signed)
Subjective:    Patient ID: Angela Hoffman, female    DOB: February 26, 1966, 49 y.o.   MRN: [id number] Patient with osteoarthritis in both knees. 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 Continues to have postoperative pain. Surgeries were done in [facility]. Patient had about 4 therapy visits postoperatively  Patient is on gabapentin 300 mg which are somewhat helpful for knee pain  Continues take Motrin 800 mg as needed not everyday   HPI CC Low back pain  Lumbar x-rays from 10/30/2013 demonstrated left greater than right L5-S1 and L4-L5 facet arthropathy, disc spaces are well maintained on lateral imaging HPI Medial branch blocks on 07/19/2014 had 100% relief of pain that was initially rated as 8 out of 10. Her pain relief persisted for about one week. We discussed that this is a favorable result however she is concerned that it was only a short duration of response. We discussed usual procedure including repeat medial branch blocks.  Back pain is slightly worse than the pain although knee pain is not far behind. She has bilateral knee contractures. Pain Inventory Average Pain 8 Pain Right Now 10 My pain is constant, burning, dull, stabbing and aching  In the last 24 hours, has pain interfered with the following? General activity 5 Relation with others 4 Enjoyment of life 4 What TIME of day is your pain at its worst? daytime Sleep (in general) Poor  Pain is worse with: walking and standing Pain improves with: heat/ice and medication Relief from Meds: 3  Mobility use a cane how many minutes can you walk? 5-10 ability to climb steps?  no do you drive?  yes  Function disabled: date disabled .  Neuro/Psych trouble walking  Prior Studies Any changes since last visit?  no  Physicians involved in your care Any changes since last visit?  no   Family History  Problem Relation Age of Onset  . Kidney  failure Mother   . Cirrhosis Father    History   Social History  . Marital Status: Single    Spouse Name: N/A  . Number of Children: N/A  . Years of Education: N/A   Occupational History  . disabled    Social History Main Topics  . Smoking status: Former Smoker -- 2.00 packs/day    Types: Cigarettes    Quit date: 05/25/2013  . Smokeless tobacco: Never Used     Comment: pt states she does not smoke often  . Alcohol Use: No  . Drug Use: No  . Sexual Activity: Yes   Other Topics Concern  . None   Social History Narrative   Past Surgical History  Procedure Laterality Date  . Cholecystectomy    . Tonsillectomy    . Tubal ligation    . Ankle surgery Left   . Tubal ligation    . Ankle surgery Left   . Replacement total knee bilateral Bilateral   . Achilles tendon repair Right    Past Medical History  Diagnosis Date  . Asthma   . Hypertension    BP 138/90 mmHg  Pulse 86  Resp 14  SpO2 96%  Opioid Risk Score:   Fall Risk Score: Low Fall Risk (0-5 points)`1  Depression screen PHQ 2/9  No flowsheet data found.   Review of Systems  Constitutional: Negative.   HENT: Negative.   Eyes: Negative.   Respiratory: Positive for shortness of breath.   Cardiovascular: Negative.  Gastrointestinal: Negative.   Endocrine: Negative.   Genitourinary: Negative.   Musculoskeletal: Positive for myalgias, back pain and arthralgias.  Skin: Negative.   Allergic/Immunologic: Negative.   Neurological:       Trouble walking  Hematological: Negative.   Psychiatric/Behavioral: Negative.        Objective:   Physical Exam  Constitutional: She is oriented to person, place, and time. She appears well-developed and well-nourished.  HENT:  Head: Normocephalic and atraumatic.  Eyes: Conjunctivae and EOM are normal. Pupils are equal, round, and reactive to light.  Neurological: She is alert and oriented to person, place, and time. A sensory deficit is present. Gait abnormal.    Antalgic gait forward flexed with incomplete knee extension  Psychiatric: She has a normal mood and affect.  Nursing note and vitals reviewed.    Right knee flexion 120, extension -20 Left knee flexion to 90 extension to -30  Low back has tenderness palpation lumbosacral junction Pain with extension greater than plane with flexion. Negative straight leg raising test except for some knee pain She has 4/5 strength in the hip flexors 4 minus in the knee extensors 4 and ankle dorsal flexor plantar flexor     Assessment & Plan:  1. Chronic low back pain is lumbar spondylosis documented by imaging studies, concordant responses in terms of her examination. No radicular type symptomatology. Recommend repeat medial branch blocks bilaterally Also discussed radiofrequency ablation If only temporary relief of at least 50%  2. Chronic postoperative knee pain. We discussed additional procedures such as cryo-neurolysis Patient is interested in pursuing. She does have proper indications

## 2014-09-20 ENCOUNTER — Other Ambulatory Visit: Payer: Self-pay | Admitting: Physical Medicine & Rehabilitation

## 2014-09-20 ENCOUNTER — Ambulatory Visit (HOSPITAL_BASED_OUTPATIENT_CLINIC_OR_DEPARTMENT_OTHER): Payer: Medicaid Other | Admitting: Physical Medicine & Rehabilitation

## 2014-09-20 ENCOUNTER — Encounter: Payer: Self-pay | Admitting: Physical Medicine & Rehabilitation

## 2014-09-20 ENCOUNTER — Encounter: Payer: Medicaid Other | Attending: Physical Medicine & Rehabilitation

## 2014-09-20 VITALS — BP 178/82 | HR 92 | Resp 14

## 2014-09-20 DIAGNOSIS — G8929 Other chronic pain: Secondary | ICD-10-CM

## 2014-09-20 DIAGNOSIS — Z79899 Other long term (current) drug therapy: Secondary | ICD-10-CM | POA: Insufficient documentation

## 2014-09-20 DIAGNOSIS — M47816 Spondylosis without myelopathy or radiculopathy, lumbar region: Secondary | ICD-10-CM | POA: Diagnosis not present

## 2014-09-20 DIAGNOSIS — M549 Dorsalgia, unspecified: Secondary | ICD-10-CM

## 2014-09-20 DIAGNOSIS — Z5181 Encounter for therapeutic drug level monitoring: Secondary | ICD-10-CM | POA: Diagnosis present

## 2014-09-20 DIAGNOSIS — M25562 Pain in left knee: Secondary | ICD-10-CM | POA: Diagnosis present

## 2014-09-20 MED ORDER — OXYCODONE-ACETAMINOPHEN 5-325 MG PO TABS: 1.0000 | ORAL_TABLET | Freq: Four times a day (QID) | ORAL | Status: DC | PRN

## 2014-09-20 NOTE — Progress Notes (Signed)
  PROCEDURE RECORD [facility]   Name: Angela Hoffman DOB:04/03/1966 MRN: [id number]  Date:09/20/2014  Physician: Claudette LawsAndrew Kirsteins, MD    Nurse/CMA: Shumaker RN  Allergies: No Known Allergies  Consent Signed: Yes.    Is patient diabetic? No.  CBG today?   Pregnant: No. LMP: No LMP recorded. Patient has had a hysterectomy. (age [age])  Anticoagulants: no Anti-inflammatory: no Antibiotics: no  Procedure: bilateral medial branch blocks Position: Prone Start Time: 12:42 End Time: 12:51 Fluoro Time: 37 seconds    Time 12:30 12:52    BP 178/82 188/91    Pulse 92 103    Respirations 16 16    O2 Sat 99 98    S/S 6 6    Pain Level 9/10 8/10     D/C home with Jean RosenthalJackson , patient A & O X 3, D/C instructions reviewed, and sits independently.

## 2014-09-20 NOTE — Patient Instructions (Signed)

## 2014-09-20 NOTE — Progress Notes (Signed)
Bilateral Lumbar L3, L4  medial branch blocks and L 5 dorsal ramus injection under fluoroscopic guidance  Indication: Lumbar pain which is not relieved by medication management or other conservative care and interfering with self-care and mobility.  Informed consent was obtained after describing risks and benefits of the procedure with the patient, this includes bleeding, infection, paralysis and medication side effects.  The patient wishes to proceed and has given written consent.  The patient was placed in prone position.  The lumbar area was marked and prepped with Betadine.  One mL of 1% lidocaine was injected into each of 6 areas into the skin and subcutaneous tissue.  Then a 22-gauge 3.5 in spinal needle was inserted targeting the junction of the left S1 superior articular process and sacral ala junction. Needle was advanced under fluoroscopic guidance.  Bone contact was made.  Omnipaque 180 was injected x 0.5 mL demonstrating no intravascular uptake.  Then a solution containing one mL of 4 mg per mL dexamethasone and 3 mL of 2% MPF lidocaine was injected x 0.5 mL.  Then the left L5 superior articular process in transverse process junction was targeted.  Bone contact was made.  Omnipaque 180 was injected x 0.5 mL demonstrating no intravascular uptake. Then a solution containing one mL of 4 mg per mL dexamethasone and 3 mL of 2% MPF lidocaine was injected x 0.5 mL.  Then the left L4 superior articular process in transverse process junction was targeted.  Bone contact was made.  Omnipaque 180 was injected x 0.5 mL demonstrating no intravascular uptake.  Then a solution containing one mL of 4 mg per mL dexamethasone and 3 mL if 2% MPF lidocaine was injected x 0.5 mL.  This same procedure was performed on the right side using the same needle, technique and injectate.  Patient tolerated procedure well.  Post procedure instructions were given.  Preinjection pain level 9/10 Post injection pain level  8/10  The patient asking for oxycodone refill last pill taken yesterday, no pills and bottle today, her fill date 08/24/2014, recheck urine drug screen if negative then would discharge for noncompliance, discussed with patient, RN present. Prescription for 120 oxycodone 5 mg tablets given today

## 2014-09-21 LAB — PMP ALCOHOL METABOLITE (ETG): ETGU: NEGATIVE ng/mL

## 2014-09-24 LAB — OPIATES/OPIOIDS (LC/MS-MS)
Codeine Urine: NEGATIVE ng/mL (ref <50)
HYDROCODONE: NEGATIVE ng/mL (ref <50)
HYDROMORPHONE: NEGATIVE ng/mL (ref <50)
MORPHINE: NEGATIVE ng/mL (ref <50)
Norhydrocodone, Ur: NEGATIVE ng/mL (ref <50)
Noroxycodone, Ur: NEGATIVE ng/mL — AB (ref <50)
Oxymorphone: 206 ng/mL (ref <50)

## 2014-09-24 LAB — OXYCODONE, URINE (LC/MS-MS)
Noroxycodone, Ur: NEGATIVE ng/mL — AB (ref <50)
Oxycodone, ur: >50000 ng/mL (ref <50)
Oxymorphone: 206 ng/mL (ref <50)

## 2014-09-25 LAB — PRESCRIPTION MONITORING PROFILE (SOLSTAS)
Amphetamine/Meth: NEGATIVE ng/mL
BENZODIAZEPINE SCREEN, URINE: NEGATIVE ng/mL
Barbiturate Screen, Urine: NEGATIVE ng/mL
Buprenorphine, Urine: NEGATIVE ng/mL
CARISOPRODOL, URINE: NEGATIVE ng/mL
CREATININE, URINE: 256.9 mg/dL (ref >20.0)
Cannabinoid Scrn, Ur: NEGATIVE ng/mL
Cocaine Metabolites: NEGATIVE ng/mL
ECSTASY: NEGATIVE ng/mL
Fentanyl, Ur: NEGATIVE ng/mL
Meperidine, Ur: NEGATIVE ng/mL
Methadone Screen, Urine: NEGATIVE ng/mL
Nitrites, Initial: NEGATIVE ug/mL
PH URINE, INITIAL: 5.6 pH (ref 4.5–8.9)
Propoxyphene: NEGATIVE ng/mL
TRAMADOL UR: NEGATIVE ng/mL
Tapentadol, urine: NEGATIVE ng/mL
ZOLPIDEM, URINE: NEGATIVE ng/mL

## 2014-10-03 NOTE — Progress Notes (Signed)
Urine drug screen for this encounter is consistent for prescribed medication and its metabolite.

## 2014-10-18 ENCOUNTER — Encounter: Payer: Self-pay | Admitting: Registered Nurse

## 2014-10-18 ENCOUNTER — Encounter: Payer: Medicaid Other | Attending: Physical Medicine & Rehabilitation | Admitting: Registered Nurse

## 2014-10-18 VITALS — BP 172/99 | HR 87 | Resp 16

## 2014-10-18 DIAGNOSIS — G8929 Other chronic pain: Secondary | ICD-10-CM

## 2014-10-18 DIAGNOSIS — Z79899 Other long term (current) drug therapy: Secondary | ICD-10-CM | POA: Diagnosis not present

## 2014-10-18 DIAGNOSIS — M47816 Spondylosis without myelopathy or radiculopathy, lumbar region: Secondary | ICD-10-CM

## 2014-10-18 DIAGNOSIS — M25562 Pain in left knee: Secondary | ICD-10-CM | POA: Insufficient documentation

## 2014-10-18 DIAGNOSIS — Z5181 Encounter for therapeutic drug level monitoring: Secondary | ICD-10-CM

## 2014-10-18 DIAGNOSIS — M549 Dorsalgia, unspecified: Secondary | ICD-10-CM | POA: Diagnosis not present

## 2014-10-18 MED ORDER — OXYCODONE-ACETAMINOPHEN 5-325 MG PO TABS: 1.0000 | ORAL_TABLET | Freq: Four times a day (QID) | ORAL | Status: DC | PRN

## 2014-10-18 NOTE — Progress Notes (Signed)
Subjective:    Patient ID: Angela Hoffman, female    DOB: 1966/01/13, 49 y.o.   MRN: [id number]  HPI: Ms. Angela Hoffman is a 49 year old female who returns for follow up for chronic pain and medication refill. She says her pain is located in her lower back and right knee. She rates her pain 8. Her current exercise regime is walking short distances. She has moved to [place] she's trying to obtain a Pain Management Office closer to home. She will keep [place] posted, she verbalizes understanding. S/P Bilateral MBB she had relief for one week. She arrived to office hypertensive, she missed her anti-hypertensive medication this morning, she brought her medication with her and has taken [name] . She sat for 45 minutes blood pressure re-checked 151/88. Educated on medication compliance she verbalizes understanding.  Pain Inventory Average Pain 7 Pain Right Now 8 My pain is aching  In the last 24 hours, has pain interfered with the following? General activity 7 Relation with others 3 Enjoyment of life 3 What TIME of day is your pain at its worst? varies Sleep (in general) Fair  Pain is worse with: some activites Pain improves with: heat/ice and medication Relief from Meds: 9  Mobility use a cane  Function I need assistance with the following:  household duties  Neuro/Psych trouble walking  Prior Studies Any changes since last visit?  no  Physicians involved in your care Any changes since last visit?  no   Family History  Problem Relation Age of Onset  . Kidney failure Mother   . Cirrhosis Father    History   Social History  . Marital Status: Single    Spouse Name: N/A  . Number of Children: N/A  . Years of Education: N/A   Occupational History  . disabled    Social History Main Topics  . Smoking status: Former Smoker -- 2.00 packs/day    Types: Cigarettes    Quit date: 05/25/2013  . Smokeless tobacco: Never Used     Comment: pt states she does not smoke often   . Alcohol Use: No  . Drug Use: No  . Sexual Activity: Yes   Other Topics Concern  . None   Social History Narrative   Past Surgical History  Procedure Laterality Date  . Cholecystectomy    . Tonsillectomy    . Tubal ligation    . Ankle surgery Left   . Tubal ligation    . Ankle surgery Left   . Replacement total knee bilateral Bilateral   . Achilles tendon repair Right    Past Medical History  Diagnosis Date  . Asthma   . Hypertension    BP 172/99 mmHg  Pulse 87  Resp 16  SpO2 95%  Opioid Risk Score:   Fall Risk Score: Low Fall Risk (0-5 points) (previously educated and given handout)`1  Depression screen PHQ 2/9  No flowsheet data found.   Review of Systems  Musculoskeletal: Positive for back pain and gait problem.  All other systems reviewed and are negative.      Objective:   Physical Exam  Constitutional: She is oriented to person, place, and time. She appears well-developed and well-nourished.  HENT:  Head: Normocephalic and atraumatic.  Neck: Normal range of motion. Neck supple.  Cardiovascular: Normal rate and regular rhythm.   Pulmonary/Chest: Effort normal and breath sounds normal.  Musculoskeletal:  Normal Muscle Bulk and Muscle Testing Reveals: Upper extremities: Full ROM and Muscle strength 5/5 Lumbar Paraspinal  Tenderness: L-3- L-5 Lower Extremities: Full ROM and Muscle Strength 5/5 Arises from chair with ease Narrow Based Gait   Neurological: She is alert and oriented to person, place, and time.  Skin: Skin is warm and dry.  Psychiatric: She has a normal mood and affect.  Nursing note and vitals reviewed.         Assessment & Plan:  1. 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 S/P Left Knee Arthroscopic on 03/12/2014 via Dr. Thalia BloodgoodNeuman Refilled: Percocet 5/325mg  one tablet every 6 hours as  needed #120.Second script given to accommodate schedule appointment, 2. Chronic low back pain no radicular signs.Continue with Heat and exercise regime.  20 minutes of face to face patient care time was spent during this visit. All questions were encouraged and answered.  F/U in 1 month

## 2014-11-29 ENCOUNTER — Encounter: Payer: Medicaid Other | Admitting: Registered Nurse

## 2014-12-17 ENCOUNTER — Encounter: Payer: Medicaid Other | Admitting: Registered Nurse

## 2014-12-19 ENCOUNTER — Encounter: Payer: Medicaid Other | Attending: Physical Medicine & Rehabilitation | Admitting: Registered Nurse

## 2014-12-19 ENCOUNTER — Encounter: Payer: Self-pay | Admitting: Registered Nurse

## 2014-12-19 VITALS — BP 137/74 | HR 99 | Resp 16

## 2014-12-19 DIAGNOSIS — M47816 Spondylosis without myelopathy or radiculopathy, lumbar region: Secondary | ICD-10-CM

## 2014-12-19 DIAGNOSIS — M25561 Pain in right knee: Secondary | ICD-10-CM | POA: Diagnosis not present

## 2014-12-19 DIAGNOSIS — Z5181 Encounter for therapeutic drug level monitoring: Secondary | ICD-10-CM | POA: Diagnosis not present

## 2014-12-19 DIAGNOSIS — Z79899 Other long term (current) drug therapy: Secondary | ICD-10-CM | POA: Insufficient documentation

## 2014-12-19 DIAGNOSIS — M549 Dorsalgia, unspecified: Secondary | ICD-10-CM

## 2014-12-19 DIAGNOSIS — M25562 Pain in left knee: Secondary | ICD-10-CM | POA: Diagnosis present

## 2014-12-19 DIAGNOSIS — G8929 Other chronic pain: Secondary | ICD-10-CM

## 2014-12-19 MED ORDER — OXYCODONE-ACETAMINOPHEN 7.5-325 MG PO TABS: 1.0000 | ORAL_TABLET | Freq: Four times a day (QID) | ORAL | Status: DC | PRN

## 2014-12-19 NOTE — Progress Notes (Signed)
Subjective:    Patient ID: Angela Hoffman, female    DOB: [birth year], 49 y.o.   MRN: [id number]  HPI: Angela Hoffman is a 49 year old female who returns for follow up for chronic pain and medication refill. She says her pain is located in her lower back andbilateral knee's. Also states she's having excruciating pain in her bilateral knees. Facial grimacing noted with palpation. Encouraged to follow up with her orthopedist  and her Percocet will be  Increased this month and will re-evaluate next month. She verbalizes understanding.  She rates her pain 9. Her current exercise regime is walking to mailbox daily.. She states she will stay at this Pain Management Office even though she lives in  to [place].   Pain Inventory Average Pain 9 Pain Right Now 9 My pain is burning  In the last 24 hours, has pain interfered with the following? General activity 8 Relation with others 0 Enjoyment of life 0 What TIME of day is your pain at its worst? night Sleep (in general) Poor  Pain is worse with: walking and bending Pain improves with: medication Relief from Meds: 5  Mobility walk without assistance how many minutes can you walk? 3-4 ability to climb steps?  yes do you drive?  yes  Function I need assistance with the following:  household duties  Neuro/Psych No problems in this area  Prior Studies Any changes since last visit?  no  Physicians involved in your care Any changes since last visit?  no   Family History  Problem Relation Age of Onset  . Kidney failure Mother   . Cirrhosis Father    History   Social History  . Marital Status: Single    Spouse Name: N/A  . Number of Children: N/A  . Years of Education: N/A   Occupational History  . disabled    Social History Main Topics  . Smoking status: Former Smoker -- 2.00 packs/day    Types: Cigarettes    Quit date: 05/25/2013  . Smokeless tobacco: Never Used     Comment: pt states she does not smoke often  .  Alcohol Use: No  . Drug Use: No  . Sexual Activity: Yes   Other Topics Concern  . None   Social History Narrative   Past Surgical History  Procedure Laterality Date  . Cholecystectomy    . Tonsillectomy    . Tubal ligation    . Ankle surgery Left   . Tubal ligation    . Ankle surgery Left   . Replacement total knee bilateral Bilateral   . Achilles tendon repair Right    Past Medical History  Diagnosis Date  . Asthma   . Hypertension    BP 137/74 mmHg  Pulse 99  Resp 16  SpO2 100%  Opioid Risk Score:   Fall Risk Score:  `1  Depression screen PHQ 2/9  Depression screen [facility] 2/9 12/19/2014 12/19/2014  Decreased Interest 0 1  Down, Depressed, Hopeless 1 0  PHQ - 2 Score 1 1  Altered sleeping 0 -  Tired, decreased energy 0 -  Change in appetite 0 -  Feeling bad or failure about yourself  0 -  Trouble concentrating 1 -  Moving slowly or fidgety/restless 0 -  Suicidal thoughts 0 -  PHQ-9 Score 2 -     Review of Systems  Respiratory: Positive for shortness of breath.   All other systems reviewed and are negative.      Objective:  Physical Exam  Constitutional: She is oriented to person, place, and time. She appears well-developed and well-nourished.  HENT:  Head: Normocephalic and atraumatic.  Neck: Normal range of motion. Neck supple.  Cardiovascular: Normal rate and regular rhythm.   Pulmonary/Chest: Effort normal. She has wheezes.  Rhonchi   Musculoskeletal:  Normal Muscle Bulk and Muscle Testing Reveals: Upper Extremities: Full ROM and Muscle Strength 5/5 Lumbar Paraspinal Tenderness: L-3- L-5 Lower Extremities: Full ROM and Muscle Strength 5/5 Left Lower Extremity Flexion Produces Pain into Patella Arises from chair slowy Antalgic Gait  Neurological: She is alert and oriented to person, place, and time.  Skin: Skin is warm and dry.  Psychiatric: She has a normal mood and affect.  Nursing note and vitals reviewed.         Assessment & Plan:    1. 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 S/P Left Knee Arthroscopic on 03/12/2014 via Dr. Thalia Bloodgood RX: Increased: Percocet 7.5/325mg  one tablet every 6 hours as needed #120. 2. Chronic low back pain no radicular signs.Continue with Heat and exercise regime.  20 minutes of face to face patient care time was spent during this visit. All questions were encouraged and answered.  F/U in 1 month

## 2015-01-17 ENCOUNTER — Encounter: Payer: Medicaid Other | Attending: Physical Medicine & Rehabilitation | Admitting: Registered Nurse

## 2015-01-17 ENCOUNTER — Encounter: Payer: Self-pay | Admitting: Registered Nurse

## 2015-01-17 VITALS — BP 149/95 | HR 101

## 2015-01-17 DIAGNOSIS — M549 Dorsalgia, unspecified: Secondary | ICD-10-CM

## 2015-01-17 DIAGNOSIS — M25561 Pain in right knee: Secondary | ICD-10-CM

## 2015-01-17 DIAGNOSIS — M47816 Spondylosis without myelopathy or radiculopathy, lumbar region: Secondary | ICD-10-CM

## 2015-01-17 DIAGNOSIS — M25562 Pain in left knee: Secondary | ICD-10-CM | POA: Diagnosis not present

## 2015-01-17 DIAGNOSIS — Z79899 Other long term (current) drug therapy: Secondary | ICD-10-CM | POA: Diagnosis present

## 2015-01-17 DIAGNOSIS — Z5181 Encounter for therapeutic drug level monitoring: Secondary | ICD-10-CM | POA: Diagnosis not present

## 2015-01-17 DIAGNOSIS — G8929 Other chronic pain: Secondary | ICD-10-CM

## 2015-01-17 MED ORDER — OXYCODONE-ACETAMINOPHEN 7.5-325 MG PO TABS: 1.0000 | ORAL_TABLET | Freq: Four times a day (QID) | ORAL | Status: DC | PRN

## 2015-01-17 NOTE — Progress Notes (Signed)
Subjective:    Patient ID: Angela Hoffman, female    DOB: 08-26-1965, 49 y.o.   MRN: [id number]  HPI: Ms. Angela Hoffman is a 49 year old female who returns for follow up for chronic pain and medication refill. She says her pain is located in her left knee and left ankle. Also states she has started working at [employer] five hours a day five days a week. She's experiencing increase intensity of pain in her lower extremities and swelling. Encouraged to buy support stockings she states she has them. Also stated she has support shoes. Will allow an extra 0.5-1 tablet on work days only!!!!!!!! She verbalizes understanding. She rates her pain 8. Her current exercise regime is walking  . Pain Inventory Average Pain 8 Pain Right Now 8 My pain is sharp  In the last 24 hours, has pain interfered with the following? General activity 7 Relation with others 7 Enjoyment of life 7 What TIME of day is your pain at its worst? morning and night Sleep (in general) Fair  Pain is worse with: walking, bending, sitting and standing Pain improves with: medication Relief from Meds: 2  Mobility ability to climb steps?  yes do you drive?  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  no  Physicians involved in your care Any changes since last visit?  no   Family History  Problem Relation Age of Onset  . Kidney failure Mother   . Cirrhosis Father    Social History   Social History  . Marital Status: Single    Spouse Name: N/A  . Number of Children: N/A  . Years of Education: N/A   Occupational History  . disabled    Social History Main Topics  . Smoking status: Former Smoker -- 2.00 packs/day    Types: Cigarettes    Quit date: 05/25/2013  . Smokeless tobacco: Never Used     Comment: pt states she does not  smoke often  . Alcohol Use: No  . Drug Use: No  . Sexual Activity: Yes   Other Topics Concern  . None   Social History Narrative   Past Surgical History  Procedure Laterality Date  . Cholecystectomy    . Tonsillectomy    . Tubal ligation    . Ankle surgery Left   . Tubal ligation    . Ankle surgery Left   . Replacement total knee bilateral Bilateral   . Achilles tendon repair Right    Past Medical History  Diagnosis Date  . Asthma   . Hypertension    BP 149/95 mmHg  Pulse 101  SpO2 96%  Opioid Risk Score:   Fall Risk Score:  `1  Depression screen PHQ 2/9  Depression screen [facility] 2/9 01/17/2015 12/19/2014 12/19/2014  Decreased Interest 1 0 1  Down, Depressed, Hopeless 0 1 0  PHQ - 2 Score 1 1 1   Altered sleeping - 0 -  Tired, decreased energy - 0 -  Change in appetite - 0 -  Feeling bad or failure about yourself  - 0 -  Trouble concentrating - 1 -  Moving slowly or fidgety/restless - 0 -  Suicidal thoughts - 0 -  PHQ-9 Score - 2 -    Review of Systems  Respiratory: Positive for shortness of breath.   All  other systems reviewed and are negative.      Objective:   Physical Exam  Constitutional: She is oriented to person, place, and time. She appears well-developed and well-nourished.  HENT:  Head: Normocephalic and atraumatic.  Neck: Normal range of motion. Neck supple.  Cardiovascular: Normal rate and regular rhythm.   Pulmonary/Chest: Effort normal and breath sounds normal.  Musculoskeletal: She exhibits edema.  Normal Muscle Bulk and Muscle Testing Reveals: Upper Extremities: Full ROM and Muscle Strength 5/5 Lower Extremities: Full ROM and Muscle Strength 5/5 Left Lower Extremity Flexion Produces Pain into Patella Arises from chair slowly Antalgic Gait   Neurological: She is alert and oriented to person, place, and time.  Skin: Skin is warm and dry.  Psychiatric: She has a normal mood and affect.  Nursing note and vitals reviewed.           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 S/P Left Knee Arthroscopic on 03/12/2014 via Dr. Thalia Bloodgood Refilled: Percocet 7.5/325mg  one tablet every 6 hours as needed. May take an extra 0.5 tablet to 1 tablet  On work days only #135.Second script to accommodate scheduled appointment. 2. Chronic low back pain no radicular signs.Continue with Heat and exercise regime.  20 minutes of face to face patient care time was spent during this visit. All questions were encouraged and answered.  F/U in 1 month

## 2015-02-18 ENCOUNTER — Encounter: Payer: Self-pay | Admitting: Registered Nurse

## 2015-02-18 ENCOUNTER — Encounter: Payer: Medicaid Other | Attending: Physical Medicine & Rehabilitation | Admitting: Registered Nurse

## 2015-02-18 ENCOUNTER — Other Ambulatory Visit: Payer: Self-pay | Admitting: Physical Medicine & Rehabilitation

## 2015-02-18 VITALS — BP 148/90 | HR 103

## 2015-02-18 DIAGNOSIS — M25561 Pain in right knee: Secondary | ICD-10-CM | POA: Diagnosis not present

## 2015-02-18 DIAGNOSIS — M47816 Spondylosis without myelopathy or radiculopathy, lumbar region: Secondary | ICD-10-CM

## 2015-02-18 DIAGNOSIS — Z79899 Other long term (current) drug therapy: Secondary | ICD-10-CM | POA: Insufficient documentation

## 2015-02-18 DIAGNOSIS — G8929 Other chronic pain: Secondary | ICD-10-CM

## 2015-02-18 DIAGNOSIS — Z5181 Encounter for therapeutic drug level monitoring: Secondary | ICD-10-CM | POA: Insufficient documentation

## 2015-02-18 DIAGNOSIS — M25562 Pain in left knee: Secondary | ICD-10-CM | POA: Diagnosis present

## 2015-02-18 DIAGNOSIS — M549 Dorsalgia, unspecified: Secondary | ICD-10-CM | POA: Diagnosis not present

## 2015-02-18 MED ORDER — OXYCODONE-ACETAMINOPHEN 7.5-325 MG PO TABS: 1.0000 | ORAL_TABLET | Freq: Four times a day (QID) | ORAL | Status: DC | PRN

## 2015-02-18 NOTE — Progress Notes (Signed)
Subjective:    Patient ID: Angela Hoffman, female    DOB: [birth year], 49 y.o.   MRN: [id number]  HPI: Angela Hoffman is a 49 year old female who returns for follow up for chronic pain and medication refill. She says her pain is located in her bilateral knee's and lower extremities. She rates her pain 8. She's working at [employer] five hours a day five days a week. Will allow an extra 0.5-1 tablet on work days only.  Her current exercise regime is walking.   Pain Inventory Average Pain 8 Pain Right Now 8 My pain is aching  In the last 24 hours, has pain interfered with the following? General activity 0 Relation with others 0 Enjoyment of life 0 What TIME of day is your pain at its worst? na Sleep (in general) Fair  Pain is worse with: walking and sitting Pain improves with: medication Relief from Meds: na  Mobility walk without assistance  Function employed # of hrs/week 30  Neuro/Psych weakness  Prior Studies Any changes since last visit?  no  Physicians involved in your care Any changes since last visit?  no   Family History  Problem Relation Age of Onset  . Kidney failure Mother   . Cirrhosis Father    Social History   Social History  . Marital Status: Single    Spouse Name: N/A  . Number of Children: N/A  . Years of Education: N/A   Occupational History  . disabled    Social History Main Topics  . Smoking status: Former Smoker -- 2.00 packs/day    Types: Cigarettes    Quit date: 05/25/2013  . Smokeless tobacco: Never Used     Comment: pt states she does not smoke often  . Alcohol Use: No  . Drug Use: No  . Sexual Activity: Yes   Other Topics Concern  . None   Social History Narrative   Past Surgical History  Procedure Laterality Date  . Cholecystectomy    . Tonsillectomy    . Tubal ligation    . Ankle surgery Left   . Tubal ligation    . Ankle surgery Left   . Replacement total knee bilateral Bilateral   . Achilles tendon  repair Right    Past Medical History  Diagnosis Date  . Asthma   . Hypertension    BP 148/90 mmHg  Pulse 103  SpO2 97%  Opioid Risk Score:   Fall Risk Score:  `1  Depression screen PHQ 2/9  Depression screen [facility] 2/9 02/18/2015 01/17/2015 12/19/2014 12/19/2014  Decreased Interest 1 1 0 1  Down, Depressed, Hopeless 0 0 1 0  PHQ - 2 Score Altered sleeping - - 0 -  Tired, decreased energy - - 0 -  Change in appetite - - 0 -  Feeling bad or failure about yourself  - - 0 -  Trouble concentrating - - 1 -  Moving slowly or fidgety/restless - - 0 -  Suicidal thoughts - - 0 -  PHQ-9 Score - - 2 -     Review of Systems  Neurological: Positive for weakness.  All other systems reviewed and are negative.      Objective:   Physical Exam  Constitutional: She is oriented to person, place, and time. She appears well-developed and well-nourished.  HENT:  Head: Normocephalic and atraumatic.  Neck: Normal range of motion. Neck supple.  Cardiovascular: Normal rate and regular rhythm.   Pulmonary/Chest:  Effort normal and breath sounds normal.  Musculoskeletal:  Normal Muscle Bulk and Muscle Testing Reveals: Upper Extremities: Full ROM and Muscle strength 5/5 Lower Extremities: Full ROM and Muscle Strength 5/5 Left Lower Extremity Flexion Produces pain into Patella Arises from chair with ease Narrow Based Gait  Neurological: She is alert and oriented to person, place, and time.  Skin: Skin is warm and dry.  Psychiatric: She has a normal mood and affect.  Nursing note and vitals reviewed.         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 S/P Left Knee Arthroscopic on 03/12/2014 via Dr. Thalia Bloodgood Refilled: Percocet 7.5/325mg  one tablet every 6 hours as needed. May take an extra 0.5 tablet to 1 tablet On work days only  #135. 2. Chronic low back pain no radicular signs.Continue with Heat and exercise regime.  20 minutes of face to face patient care time was spent during this visit. All questions were encouraged and answered.  F/U in 1 month

## 2015-02-19 LAB — PMP ALCOHOL METABOLITE (ETG)

## 2015-02-20 ENCOUNTER — Ambulatory Visit: Payer: Medicaid Other | Admitting: Registered Nurse

## 2015-02-23 LAB — PRESCRIPTION MONITORING PROFILE (SOLSTAS)
Amphetamine/Meth: NEGATIVE ng/mL
BUPRENORPHINE, URINE: NEGATIVE ng/mL
Barbiturate Screen, Urine: NEGATIVE ng/mL
CARISOPRODOL, URINE: NEGATIVE ng/mL
CREATININE, URINE: 289.17 mg/dL (ref >20.0)
Cannabinoid Scrn, Ur: NEGATIVE ng/mL
Cocaine Metabolites: NEGATIVE ng/mL
ECSTASY: NEGATIVE ng/mL
FENTANYL URINE: NEGATIVE ng/mL
MEPERIDINE UR: NEGATIVE ng/mL
METHADONE SCREEN, URINE: NEGATIVE ng/mL
Nitrites, Initial: NEGATIVE ug/mL
PH URINE, INITIAL: 7.3 pH (ref 4.5–8.9)
PROPOXYPHENE: NEGATIVE ng/mL
Tapentadol, urine: NEGATIVE ng/mL
Tramadol Scrn, Ur: NEGATIVE ng/mL
Zolpidem, Urine: NEGATIVE ng/mL

## 2015-02-23 LAB — OXYCODONE, URINE (LC/MS-MS)
Noroxycodone, Ur: NEGATIVE ng/mL — AB (ref <50)
Oxycodone, ur: 8939 ng/mL (ref <50)
Oxymorphone: NEGATIVE ng/mL — AB (ref <50)

## 2015-02-23 LAB — OPIATES/OPIOIDS (LC/MS-MS)
CODEINE URINE: NEGATIVE ng/mL (ref <50)
HYDROCODONE: NEGATIVE ng/mL (ref <50)
Hydromorphone: NEGATIVE ng/mL (ref <50)
MORPHINE: NEGATIVE ng/mL (ref <50)
NORHYDROCODONE, UR: NEGATIVE ng/mL (ref <50)
Noroxycodone, Ur: NEGATIVE ng/mL — AB (ref <50)
Oxycodone, ur: 8939 ng/mL (ref <50)
Oxymorphone: NEGATIVE ng/mL — AB (ref <50)

## 2015-02-23 LAB — BENZODIAZEPINES (GC/LC/MS), URINE
ALPRAZOLAMU: NEGATIVE ng/mL — AB (ref <25)
Clonazepam metabolite (GC/LC/MS), ur confirm: NEGATIVE ng/mL (ref <25)
Flurazepam metabolite (GC/LC/MS), ur confirm: NEGATIVE ng/mL (ref <50)
Lorazepam (GC/LC/MS), ur confirm: NEGATIVE ng/mL (ref <50)
MIDAZOLAMU: NEGATIVE ng/mL (ref <50)
Nordiazepam (GC/LC/MS), ur confirm: NEGATIVE ng/mL (ref <50)
Oxazepam (GC/LC/MS), ur confirm: NEGATIVE ng/mL (ref <50)
TRIAZOLAMU: NEGATIVE ng/mL (ref <50)
Temazepam (GC/LC/MS), ur confirm: NEGATIVE ng/mL (ref <50)

## 2015-02-23 LAB — ETHYL GLUCURONIDE, URINE
ETGU: 4264 ng/mL — AB (ref <500)
Ethyl Sulfate (ETS): 958 ng/mL — ABNORMAL HIGH (ref <100)

## 2015-02-27 NOTE — Progress Notes (Signed)
Urine drug screen for this encounter is consistent for prescribed medication.  The oxycodone is present but there is no metabolites present. Her urine is also positive for alcohol. A warning letter will be mailed

## 2015-04-01 ENCOUNTER — Ambulatory Visit: Payer: Medicaid Other | Admitting: Registered Nurse

## 2015-04-02 ENCOUNTER — Telehealth: Payer: Self-pay

## 2015-04-02 NOTE — Telephone Encounter (Signed)
If we can get documenttion that the DDS did a painful procedure, she can fill the RX and take in addition to her pain meds

## 2015-04-02 NOTE — Telephone Encounter (Signed)
Patient called in today and stated that she had to have her "rotten" tooth pulled out of her mouth and the Dentist gave her a script for 20 Hydrocodone tablets. She states that she took the Rx to the pharmacy but that she has not picked them up yet. I advised her that she is on a pain contract and she should not have filled the medication. She states that she can pick up the medication and bring it to us to destroy. She said she didn't want to jeopardize her pain management contract. Please advise on what she should do. Thanks!

## 2015-04-03 ENCOUNTER — Telehealth: Payer: Self-pay

## 2015-04-03 NOTE — Telephone Encounter (Signed)
Patient says [facility] [place]. [phone].  Called and requested a note stating what patient had done.

## 2015-04-03 NOTE — Telephone Encounter (Signed)
Left message for patient to call back  

## 2015-04-08 ENCOUNTER — Ambulatory Visit: Payer: Medicaid Other | Admitting: Registered Nurse

## 2015-04-15 ENCOUNTER — Encounter: Payer: Medicaid Other | Admitting: Registered Nurse

## 2015-04-19 ENCOUNTER — Encounter: Payer: Self-pay | Admitting: Registered Nurse

## 2015-04-19 ENCOUNTER — Encounter: Payer: Medicaid Other | Admitting: Registered Nurse

## 2015-04-19 ENCOUNTER — Encounter: Payer: Medicaid Other | Attending: Physical Medicine & Rehabilitation | Admitting: Registered Nurse

## 2015-04-19 VITALS — BP 157/87 | HR 94 | Resp 14

## 2015-04-19 DIAGNOSIS — Z79899 Other long term (current) drug therapy: Secondary | ICD-10-CM | POA: Diagnosis present

## 2015-04-19 DIAGNOSIS — M47816 Spondylosis without myelopathy or radiculopathy, lumbar region: Secondary | ICD-10-CM | POA: Diagnosis not present

## 2015-04-19 DIAGNOSIS — M25562 Pain in left knee: Secondary | ICD-10-CM | POA: Diagnosis not present

## 2015-04-19 DIAGNOSIS — G894 Chronic pain syndrome: Secondary | ICD-10-CM | POA: Diagnosis not present

## 2015-04-19 DIAGNOSIS — Z5181 Encounter for therapeutic drug level monitoring: Secondary | ICD-10-CM | POA: Insufficient documentation

## 2015-04-19 MED ORDER — OXYCODONE-ACETAMINOPHEN 7.5-325 MG PO TABS: 1.0000 | ORAL_TABLET | Freq: Four times a day (QID) | ORAL | Status: AC | PRN

## 2015-04-19 NOTE — Progress Notes (Signed)
Subjective:    Patient ID: Angela LaniusMelinda Clabaugh, female    DOB: 01/28/1966, 49 y.o.   MRN: [id number]  HPI: Ms. Angela Hoffman is a 49 year old female who returns for follow up for chronic pain and medication refill. She says her pain is located in her lower back and left knee.. She rates her pain 10. She's still working at [employer] five hours a day five days a week. Her current exercise regime is walking.   Pain Inventory Average Pain 10 Pain Right Now 10 My pain is burning and stabbing  In the last 24 hours, has pain interfered with the following? General activity na Relation with others na Enjoyment of life 5 What TIME of day is your pain at its worst? Morning, night Sleep (in general) Poor  Pain is worse with: walking, standing and some activites Pain improves with: therapy/exercise and medication Relief from Meds: 5  Mobility use a cane how many minutes can you walk? 2-3 minutes ability to climb steps?  no do you drive?  no Do you have any goals in this area?  no  Function disabled: date disabled NA I need assistance with the following:  household duties  Neuro/Psych No problems in this area  Prior Studies Any changes since last visit?  no  Physicians involved in your care Any changes since last visit?  no   Family History  Problem Relation Age of Onset  . Kidney failure Mother   . Cirrhosis Father    Social History   Social History  . Marital Status: Single    Spouse Name: N/A  . Number of Children: N/A  . Years of Education: N/A   Occupational History  . disabled    Social History Main Topics  . Smoking status: Former Smoker -- 2.00 packs/day    Types: Cigarettes    Quit date: 05/25/2013  . Smokeless tobacco: Never Used     Comment: pt states she does not smoke often  . Alcohol Use: No  . Drug Use: No  . Sexual Activity: Yes   Other Topics Concern  . None   Social History Narrative   Past Surgical History  Procedure Laterality Date    . Cholecystectomy    . Tonsillectomy    . Tubal ligation    . Ankle surgery Left   . Tubal ligation    . Ankle surgery Left   . Replacement total knee bilateral Bilateral   . Achilles tendon repair Right    Past Medical History  Diagnosis Date  . Asthma   . Hypertension    BP 157/87 mmHg  Pulse 94  Resp 14  SpO2 97%  Opioid Risk Score:   Fall Risk Score:  `1  Depression screen PHQ 2/9  [facility] 2/9 02/18/2015 01/17/2015 12/19/2014 12/19/2014  Decreased Interest 1 1 0 1  Down, Depressed, Hopeless 0 0 1 0  PHQ - 2 Score 1 1 1 1   Altered sleeping - - 0 -  Tired, decreased energy - - 0 -  Change in appetite - - 0 -  Feeling bad or failure about yourself  - - 0 -  Trouble concentrating - - 1 -  Moving slowly or fidgety/restless - - 0 -  Suicidal thoughts - - 0 -  PHQ-9 Score - - 2 -      Review of Systems  Respiratory: Positive for shortness of breath.   All other systems reviewed and are negative.  Objective:   Physical Exam  Constitutional: She is oriented to person, place, and time. She appears well-developed and well-nourished.  HENT:  Head: Normocephalic and atraumatic.  Neck: Normal range of motion. Neck supple.  Cardiovascular: Normal rate and regular rhythm.   Pulmonary/Chest: Effort normal and breath sounds normal.  Musculoskeletal:  Normal Muscle Bulk and Muscle Testing Reveals: Upper Extremities: Full ROM and Muscle Strength 5/5 Thoracic Hypersensitivity: T-1- T-3 Lumbar Paraspinal Tenderness: L-3- L-5 Lower Extremities: Right Full ROM and Muscle Strength 5/5 Left: Decreased ROM, Patella with swelling notes. Left Lower Extremity Flexion Produces Pain into Patella Arises from chair slowly Antalgic gait  Neurological: She is alert and oriented to person, place, and time.  Skin: Skin is warm and dry.  Psychiatric: She has a normal mood and affect.  Nursing note and vitals reviewed.         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 S/P Left Knee Arthroscopic on 03/12/2014 via Dr. Thalia Bloodgood Refilled: Percocet 7.5/325mg  one tablet every 6 hours as needed. May take an extra 0.5 tablet to 1 tablet On work days only #135. 2. Chronic low back pain no radicular signs.Continue with Heat and exercise regime.  20 minutes of face to face patient care time was spent during this visit. All questions were encouraged and answered.  F/U in 1 month

## 2015-05-17 ENCOUNTER — Encounter: Payer: Medicaid Other | Admitting: Registered Nurse

## 2016-03-23 IMAGING — CR DG LUMBAR SPINE 2-3V CLEARING
3 series · 3 of 3 positions shown · non-contrast
Comparison: None.

CLINICAL DATA: chronic low back pain

EXAM:
LIMITED LUMBAR SPINE FOR TRAUMA CLEARING - 2-3 VIEW

[t l-spine a.p.]
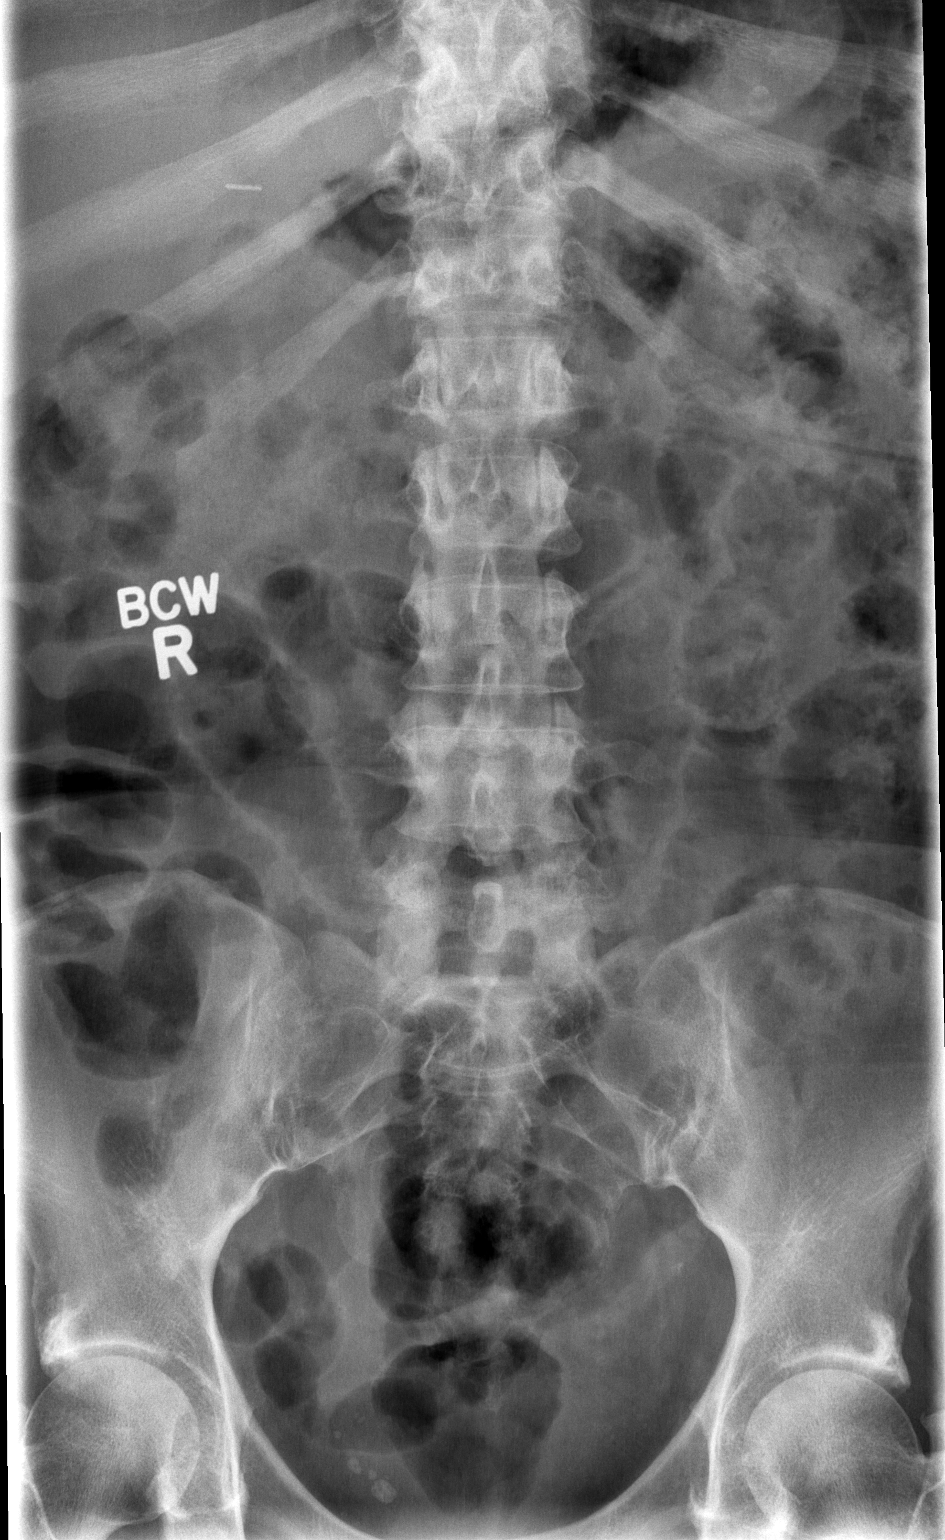

[t l-spine lat (1 of 2)]
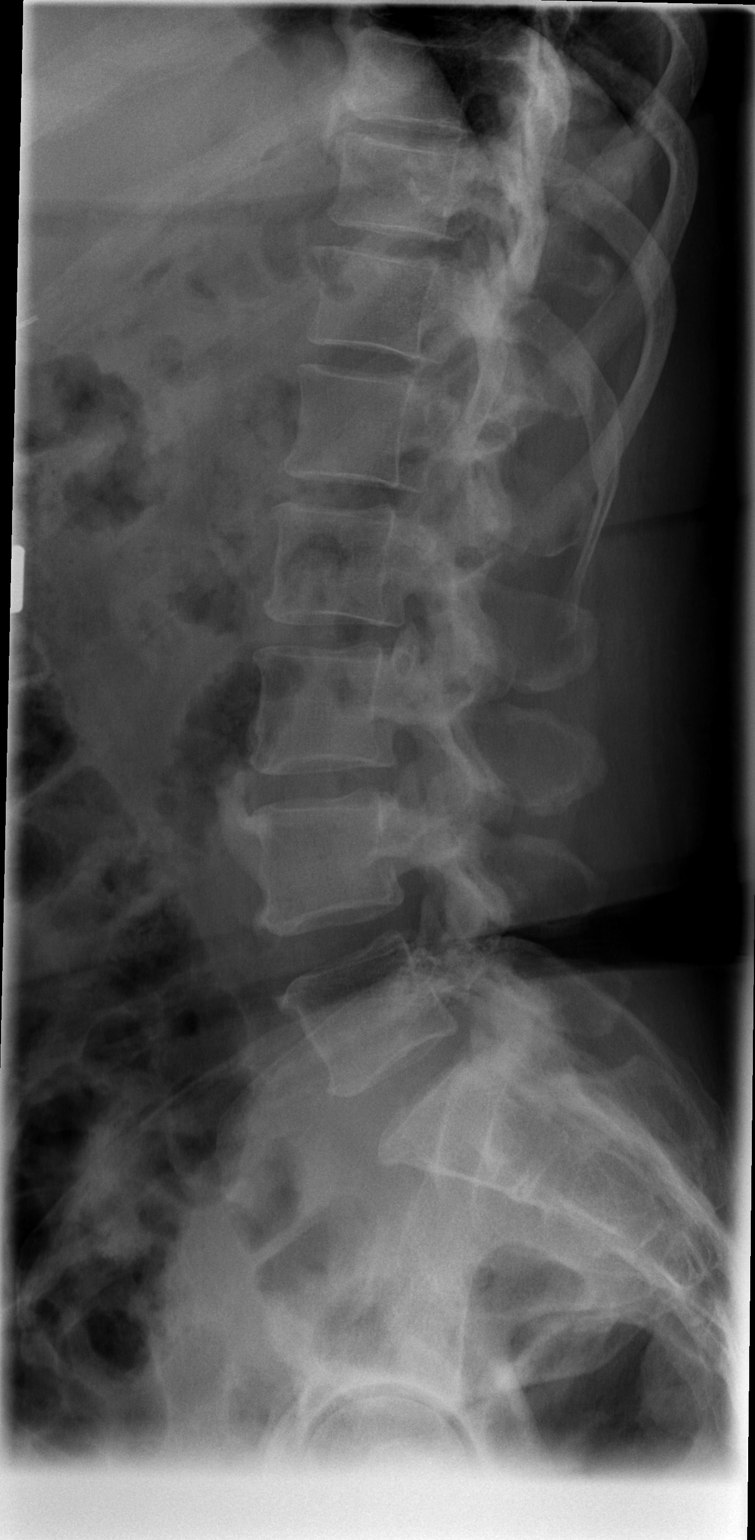

[t l-spine lat (2 of 2)]
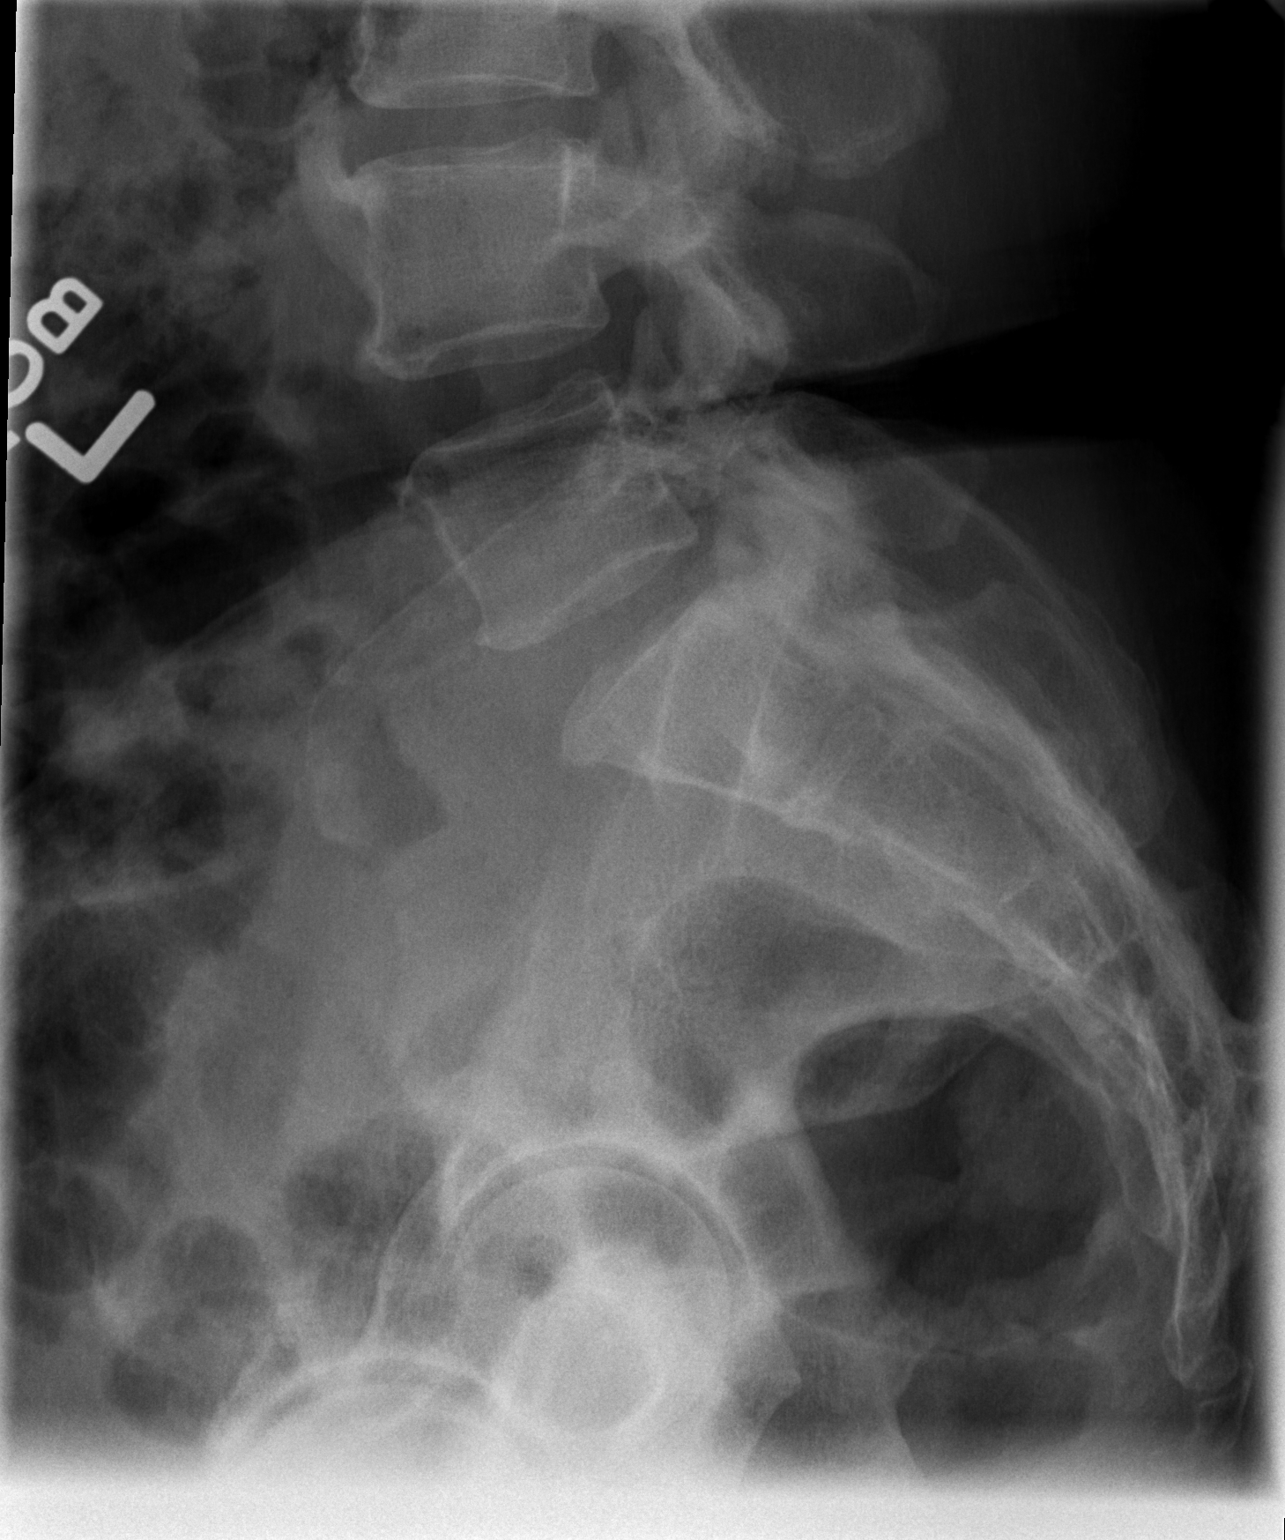

[3 of 3 positions shown; findings below may reference images not displayed]

FINDINGS: There is no evidence of lumbar spine fracture. Alignment is normal.
Intervertebral disc spaces are maintained. Flowing smoothly
marginated anterior osteophyte at L3-4.
IMPRESSION: Negative.

## 2016-05-28 IMAGING — CR DG CHEST 2V
2 series · 2 of 2 positions shown · non-contrast
Comparison: PA and lateral chest of January 03, 2014

CLINICAL DATA: Headache with shortness of breath.

EXAM:
CHEST  2 VIEW

[w chest lat]
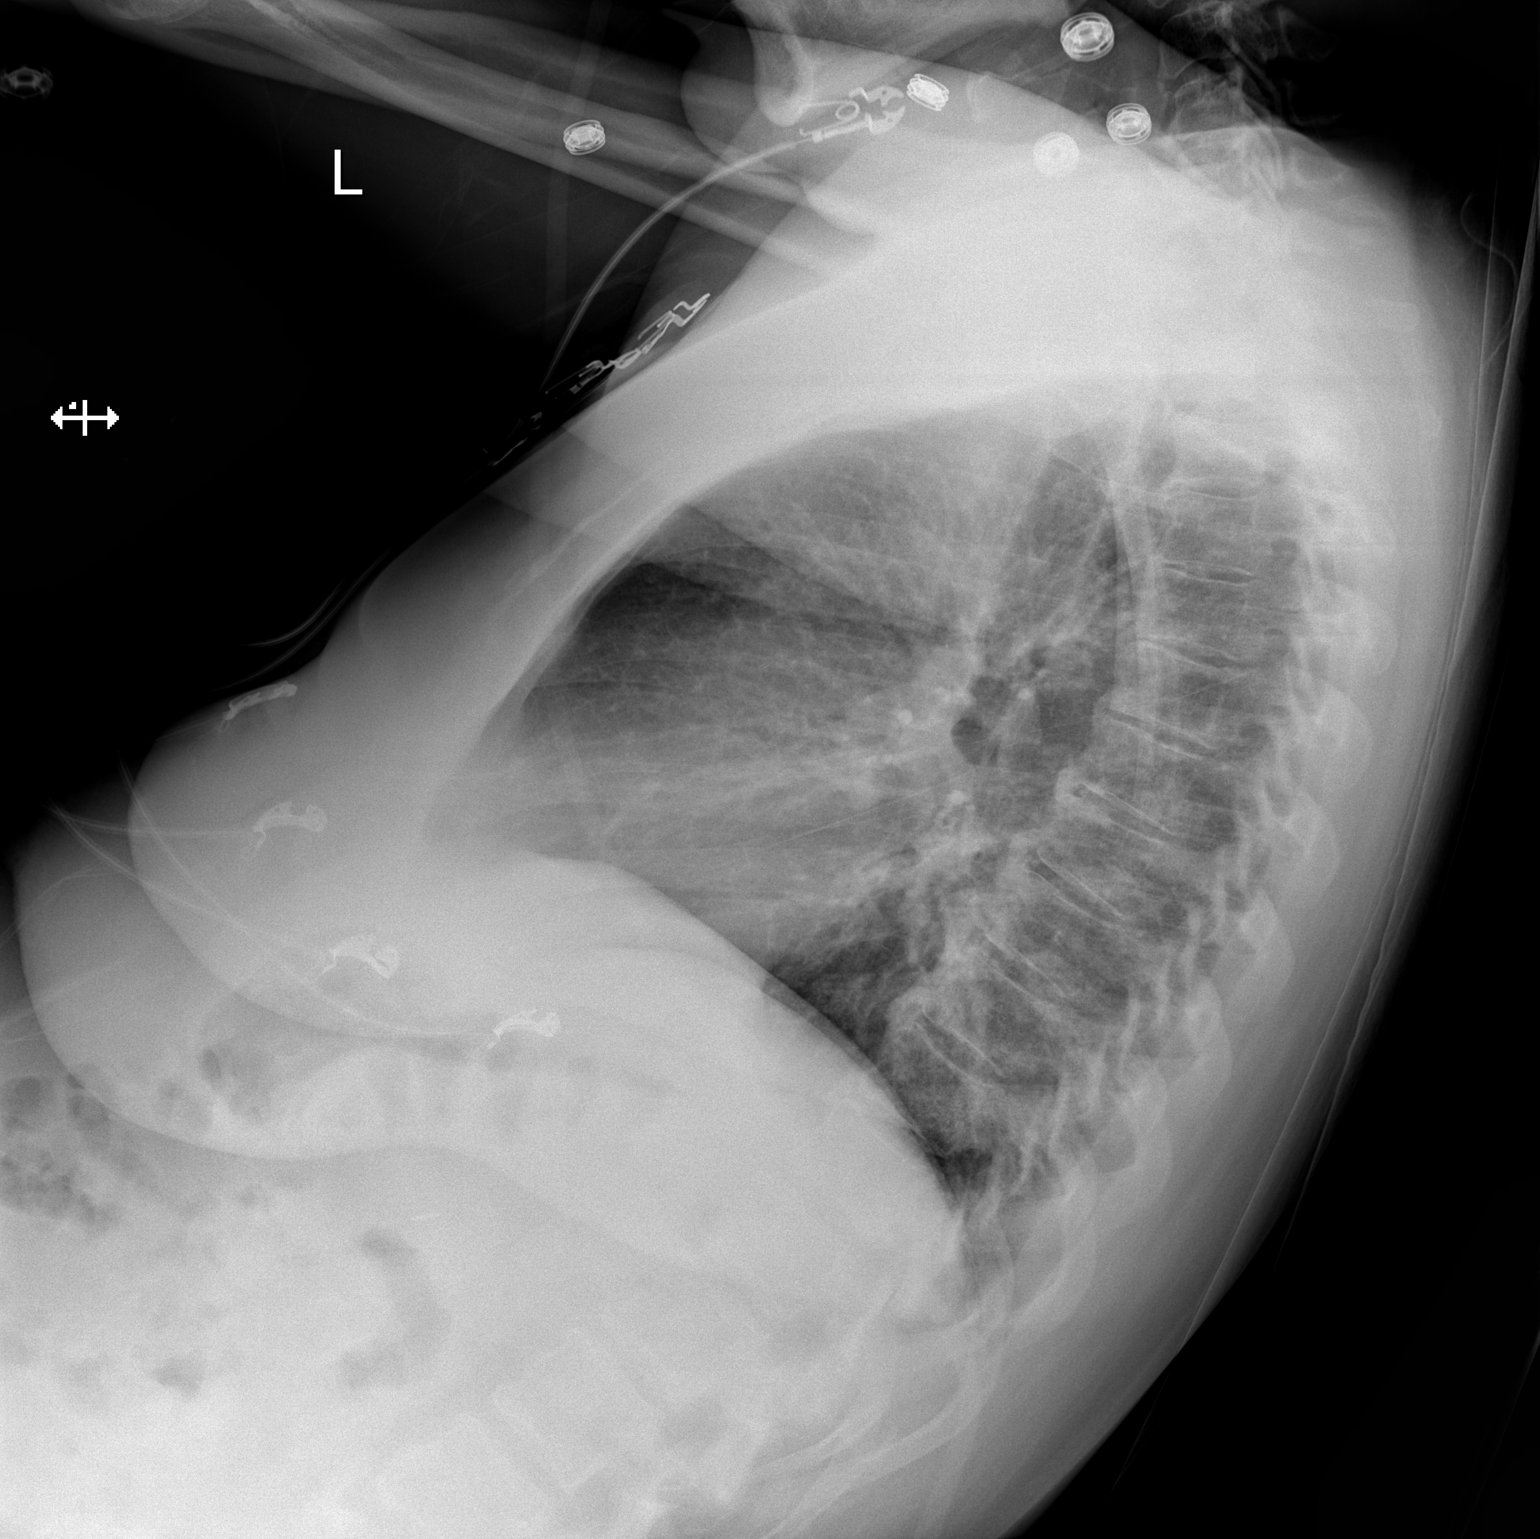

[x chest ap]
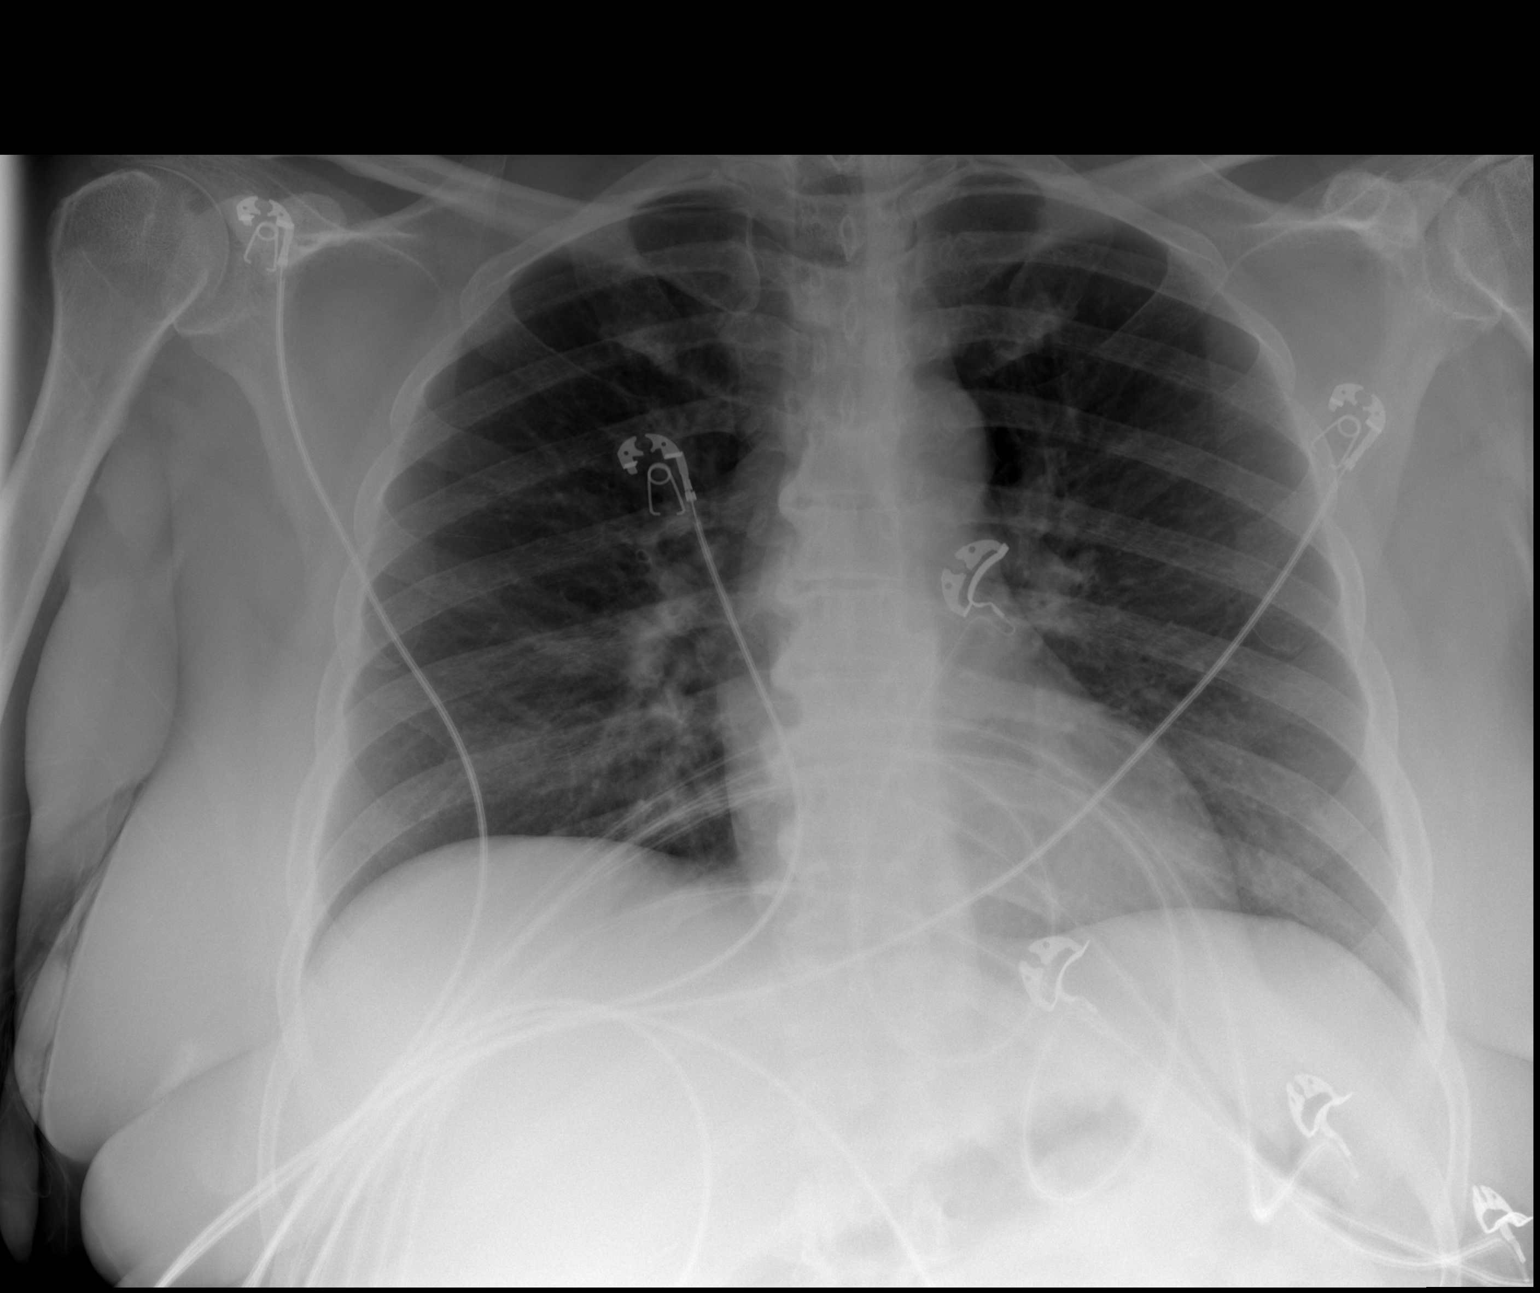

[2 of 2 positions shown; findings below may reference images not displayed]

FINDINGS: The lungs are adequately inflated. The perihilar lung markings are
prominent. There is no alveolar pneumonia. There is no pulmonary
edema or pleural effusion. The cardiac silhouette is normal. The
mediastinum is normal in width. There is calcification of the
anterior longitudinal ligament of the thoracic spine.
IMPRESSION: Increased perihilar lung markings may reflect acute bronchitis with
subsegmental atelectasis. There is no focal pneumonia. Follow-up
films are recommended following any therapy to assure clearing.

## 2016-06-13 ENCOUNTER — Encounter (HOSPITAL_COMMUNITY): Payer: Self-pay | Admitting: Emergency Medicine

## 2016-06-13 ENCOUNTER — Emergency Department (HOSPITAL_COMMUNITY): Payer: Medicaid Other

## 2016-06-13 DIAGNOSIS — I10 Essential (primary) hypertension: Secondary | ICD-10-CM | POA: Diagnosis not present

## 2016-06-13 DIAGNOSIS — R0602 Shortness of breath: Secondary | ICD-10-CM | POA: Diagnosis present

## 2016-06-13 DIAGNOSIS — J4531 Mild persistent asthma with (acute) exacerbation: Secondary | ICD-10-CM | POA: Diagnosis not present

## 2016-06-13 DIAGNOSIS — Z87891 Personal history of nicotine dependence: Secondary | ICD-10-CM | POA: Diagnosis not present

## 2016-06-13 DIAGNOSIS — Z79899 Other long term (current) drug therapy: Secondary | ICD-10-CM | POA: Diagnosis not present

## 2016-06-13 LAB — BASIC METABOLIC PANEL
ANION GAP: 12 (ref 5–15)
BUN: 14 mg/dL (ref 6–20)
CALCIUM: 9.7 mg/dL (ref 8.9–10.3)
CO2: 25 mmol/L (ref 22–32)
CREATININE: 0.65 mg/dL (ref 0.44–1.00)
Chloride: 103 mmol/L (ref 101–111)
GFR calc Af Amer: >60 mL/min (ref >60)
GLUCOSE: 124 mg/dL — AB (ref 65–99)
Potassium: 3.2 mmol/L — ABNORMAL LOW (ref 3.5–5.1)
Sodium: 140 mmol/L (ref 135–145)

## 2016-06-13 LAB — I-STAT TROPONIN, ED
TROPONIN I, POC: 0.01 ng/mL (ref 0.00–0.08)
Troponin i, poc: 0 ng/mL (ref 0.00–0.08)

## 2016-06-13 LAB — CBC
HCT: 37.2 % (ref 36.0–46.0)
HEMOGLOBIN: 12.4 g/dL (ref 12.0–15.0)
MCH: 29.2 pg (ref 26.0–34.0)
MCHC: 33.3 g/dL (ref 30.0–36.0)
MCV: 87.5 fL (ref 78.0–100.0)
Platelets: 337 10*3/uL (ref 150–400)
RBC: 4.25 MIL/uL (ref 3.87–5.11)
RDW: 14.1 % (ref 11.5–15.5)
WBC: 7.2 10*3/uL (ref 4.0–10.5)

## 2016-06-13 MED ORDER — ALBUTEROL SULFATE (2.5 MG/3ML) 0.083% IN NEBU
INHALATION_SOLUTION | RESPIRATORY_TRACT | Status: AC
  Filled 2016-06-13: qty 6

## 2016-06-13 MED ORDER — ALBUTEROL SULFATE (2.5 MG/3ML) 0.083% IN NEBU
5.0000 mg | INHALATION_SOLUTION | Freq: Once | RESPIRATORY_TRACT | Status: AC
  Administered 2016-06-13: 5 mg via RESPIRATORY_TRACT

## 2016-06-13 NOTE — ED Triage Notes (Signed)
Pt presents w/ SOB for about an hour.  States her "asthma" has started up.  Generalized Chest pain stated about the same time.  Wheezing can be heard in lungs.

## 2016-06-14 ENCOUNTER — Emergency Department (HOSPITAL_COMMUNITY)
Admission: EM | Admit: 2016-06-14 | Discharge: 2016-06-14 | Disposition: A | Discharge status: Home / Self Care | Payer: Medicaid Other | Attending: Emergency Medicine | Admitting: Emergency Medicine

## 2016-06-14 DIAGNOSIS — J4531 Mild persistent asthma with (acute) exacerbation: Secondary | ICD-10-CM

## 2016-06-14 MED ORDER — ALBUTEROL SULFATE (2.5 MG/3ML) 0.083% IN NEBU
2.5000 mg | INHALATION_SOLUTION | RESPIRATORY_TRACT | Status: AC
  Administered 2016-06-14: 2.5 mg via RESPIRATORY_TRACT
  Filled 2016-06-14: qty 3

## 2016-06-14 MED ORDER — BENZONATATE 100 MG PO CAPS: 100.0000 mg | ORAL_CAPSULE | Freq: Three times a day (TID) | ORAL | 0 refills | Status: AC

## 2016-06-14 MED ORDER — AZITHROMYCIN 250 MG PO TABS: 250.0000 mg | ORAL_TABLET | Freq: Every day | ORAL | 0 refills | Status: AC

## 2016-06-14 MED ORDER — ALBUTEROL SULFATE HFA 108 (90 BASE) MCG/ACT IN AERS: 2.0000 | INHALATION_SPRAY | Freq: Four times a day (QID) | RESPIRATORY_TRACT | 2 refills | Status: AC | PRN

## 2016-06-14 MED ORDER — PREDNISONE 10 MG PO TABS: 60.0000 mg | ORAL_TABLET | Freq: Every day | ORAL | 0 refills | Status: AC

## 2016-06-14 MED ORDER — IPRATROPIUM-ALBUTEROL 0.5-2.5 (3) MG/3ML IN SOLN
3.0000 mL | Freq: Once | RESPIRATORY_TRACT | Status: AC
  Administered 2016-06-14: 3 mL via RESPIRATORY_TRACT
  Filled 2016-06-14: qty 3

## 2016-06-14 MED ORDER — DEXAMETHASONE SODIUM PHOSPHATE 10 MG/ML IJ SOLN
10.0000 mg | Freq: Once | INTRAMUSCULAR | Status: AC
  Administered 2016-06-14: 10 mg via INTRAMUSCULAR
  Filled 2016-06-14: qty 1

## 2016-06-14 MED ORDER — AZITHROMYCIN 250 MG PO TABS
500.0000 mg | ORAL_TABLET | Freq: Once | ORAL | Status: AC
  Administered 2016-06-14: 500 mg via ORAL
  Filled 2016-06-14: qty 2

## 2016-06-14 NOTE — ED Provider Notes (Signed)
[facility]-EMERGENCY DEPT Provider Note   CSN: [id number] Arrival date & time: 06/13/16  2205 By signing my name below, I, Levon Hedger, attest that this documentation has been prepared under the direction and in the presence of Derwood Kaplan, MD . Electronically Signed: Levon Hedger, Scribe. 06/14/2016. 1:03 AM.   History   Chief Complaint Chief Complaint  Patient presents with  . Shortness of Breath  . Chest Pain    HPI Angela Hoffman is a 51 y.o. female with a history of asthma and HTN who presents to the Emergency Department complaining of constant shortness of breath onset a few hours ago. She notes associated wheezing, cough productive of yellow sputum, and central chest pain secondary to cough. No treatments attempted PTA. Pt states she typically has a dry cough and states the sputum production is new. She was given a nebulizer while in the ED with some relief of symptoms. Per pt, she only uses breathing treatments occasionally. No cardiac history of PMHx of COPD.She denies any generalized weakness or body aches, congestion, fever, chills, nausea, vomiting or any other associated symptoms.  The history is provided by the patient. No language interpreter was used.   Past Medical History:  Diagnosis Date  . Asthma   . Hypertension     Patient Active Problem List   Diagnosis Date Noted  . Lumbar facet arthropathy 08/24/2014  . HCAP (healthcare-associated pneumonia) 01/04/2014  . Acute respiratory failure with hypoxemia (HCC) 01/04/2014  . Acute bronchitis 01/04/2014  . Leukocytosis 01/04/2014  . Acute respiratory failure with hypoxia (HCC) 01/04/2014  . Asthma exacerbation 10/27/2013  . Asthma 10/27/2013  . Essential hypertension, benign 10/27/2013  . Bilateral knee pain 09/18/2013  . Chronic back pain 09/18/2013  . 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 ACHILLES TENDON  REPAIR Right   . ANKLE SURGERY Left   . ANKLE SURGERY Left   . CHOLECYSTECTOMY    . REPLACEMENT TOTAL KNEE BILATERAL Bilateral   . TONSILLECTOMY    . TUBAL LIGATION    . TUBAL LIGATION      OB History    No data available       Home Medications    Prior to Admission medications   Medication Sig Start Date End Date Taking? Authorizing Provider  albuterol (PROVENTIL HFA;VENTOLIN HFA) 108 (90 BASE) MCG/ACT inhaler Inhale 2 puffs into the lungs every 6 (six) hours as needed. For wheezing    Historical Provider, MD  ALPRAZolam Prudy Feeler) 1 MG tablet Take 1 mg by mouth 2 (two) times daily.    Historical Provider, MD  azithromycin (ZITHROMAX) 250 MG tablet Take 1 tablet (250 mg total) by mouth at bedtime. 1 tab  qhs x 4 days 06/14/16   Derwood Kaplan, MD  esomeprazole (NEXIUM) 40 MG capsule Take 1 capsule (40 mg total) by mouth 2 (two) times daily. 02/05/14   Leslye Peer, MD  ibuprofen (ADVIL,MOTRIN) 800 MG tablet Take 800 mg by mouth daily. 04/19/14   Historical Provider, MD  losartan-hydrochlorothiazide (HYZAAR) 50-12.5 MG per tablet Take 1 tablet by mouth daily. 02/09/14   Leslye Peer, MD  oxybutynin (DITROPAN) 5 MG tablet Take 5 mg by mouth daily. 03/15/14   Historical Provider, MD  oxyCODONE-acetaminophen (PERCOCET) 7.5-325 MG tablet Take 1 tablet by mouth every 6 (six) hours as needed. May take and extra 0.5 tablet to 1 tablet on work days Only!!!! 04/19/15   Riley Lam L  Maisie Fushomas, NP  polyethylene glycol (MIRALAX / GLYCOLAX) packet Take 17 g by mouth daily.    Historical Provider, MD  predniSONE (DELTASONE) 10 MG tablet Take 6 tablets (60 mg total) by mouth daily. 06/14/16   Derwood KaplanAnkit Kamisha Ell, MD  valACYclovir (VALTREX) 1000 MG tablet Take 1,000 mg by mouth daily as needed (for outbreaks).     Historical Provider, MD    Family History Family History  Problem Relation Age of Onset  . Kidney failure Mother   . Cirrhosis Father     Social History Social History  Substance Use Topics  .  Smoking status: Former Smoker    Packs/day: 2.00    Types: Cigarettes    Quit date: 05/25/2013  . Smokeless tobacco: Never Used     Comment: pt states she does not smoke often  . Alcohol use No     Allergies   Patient has no known allergies.   Review of Systems Review of Systems 10 systems reviewed and all are negative for acute change except as noted in the HPI.  Physical Exam Updated Vital Signs BP 157/98   Pulse 86   Temp 98.4 F (36.9 C) (Oral)   Resp 14   Ht 4' 11.5" (1.511 m)   Wt 150 lb (68 kg)   SpO2 98%   BMI 29.79 kg/m   Physical Exam  Constitutional: She is oriented to person, place, and time. She appears well-developed and well-nourished. No distress.  HENT:  Head: Normocephalic and atraumatic.  Eyes: Conjunctivae are normal.  Neck: No JVD present.  Cardiovascular: Normal rate and regular rhythm.   No murmur heard. Pulmonary/Chest: Effort normal. She has wheezes.  Diffuse and expiratory wheezing.   Abdominal: She exhibits no distension.  Musculoskeletal: She exhibits no edema.  No unilateral calf swelling or tenderness. No pitting edema.   Neurological: She is alert and oriented to person, place, and time.  Skin: Skin is warm and dry.  Psychiatric: She has a normal mood and affect.  Nursing note and vitals reviewed.  ED Treatments / Results  DIAGNOSTIC STUDIES:  Oxygen Saturation is 100% on RA, normal by my interpretation.    COORDINATION OF CARE:  12:56 AM Discussed treatment plan which includes abx and steroids with pt at bedside and pt agreed to plan.  Labs (all labs ordered are listed, but only abnormal results are displayed) Labs Reviewed  BASIC METABOLIC PANEL - Abnormal; Notable for the following:       Result Value   Potassium 3.2 (*)    Glucose, Bld 124 (*)    All other components within normal limits  CBC  I-STAT TROPOININ, ED  I-STAT TROPOININ, ED    EKG  EKG Interpretation  Date/Time:  Saturday June 13 2016 22:21:46  EST Ventricular Rate:  98 PR Interval:  146 QRS Duration: 76 QT Interval:  362 QTC Calculation: 462 R Axis:   93 Text Interpretation:  Normal sinus rhythm Rightward axis Borderline ECG No acute changes Nonspecific ST and T wave abnormality in the inferior leads Confirmed by Rhunette CroftNANAVATI, MD, Janey GentaANKIT [phone]([id number]) on 06/14/2016 12:40:30 AM       Radiology Dg Chest 2 View  Result Date: 06/13/2016 CLINICAL DATA:  Patient with shortness of breath and wheezing. History of asthma. EXAM: CHEST  2 VIEW COMPARISON:  Chest radiograph 01/04/2014. FINDINGS: Normal cardiac and mediastinal contours. No consolidative pulmonary opacities. No pleural effusion or pneumothorax. Thoracic spine degenerative changes. Cervical spinal fusion hardware. IMPRESSION: No active cardiopulmonary disease. Electronically Signed  By: Annia Belt M.D.   On: 06/13/2016 23:35    Procedures Procedures (including critical care time)  Medications Ordered in ED Medications  albuterol (PROVENTIL) (2.5 MG/3ML) 0.083% nebulizer solution 2.5 mg (2.5 mg Nebulization Given 06/14/16 0133)  albuterol (PROVENTIL) (2.5 MG/3ML) 0.083% nebulizer solution 5 mg (5 mg Nebulization Given 06/13/16 2229)  dexamethasone (DECADRON) injection 10 mg (10 mg Intramuscular Given 06/14/16 0127)  ipratropium-albuterol (DUONEB) 0.5-2.5 (3) MG/3ML nebulizer solution 3 mL (3 mLs Nebulization Given 06/14/16 0133)  azithromycin (ZITHROMAX) tablet 500 mg (500 mg Oral Given 06/14/16 0122)     Initial Impression / Assessment and Plan / ED Course  I have reviewed the triage vital signs and the nursing notes.  Pertinent labs & imaging results that were available during my care of the patient were reviewed by me and considered in my medical decision making (see chart for details).  Clinical Course as of Jun 14 149  Wynelle Link Jun 14, 2016  0151 Repeat exam reveals clearing of wheezing in all lung fields. Patient is not in any respiratory distress nor is there hypoxia.  Please  return to the ER if your symptoms worsen; you have increased pain, fevers, chills, inability to keep any medications down, confusion. Otherwise see the outpatient doctor as requested.   [AN]    Clinical Course User Index [AN] Derwood Kaplan, MD   I personally performed the services described in this documentation, which was scribed in my presence. The recorded information has been reviewed and is accurate.  Pt comes in with cc of DIB. Pt has hx of asthma, she is wheezing, has new cough/phlegm and she is a smoker. PT will be treated effectively as a COPD exacerbation. Pt is still wheezing a little bit, but she is no extremis. We will give her another treatment, and then anticipate d/c.   Final Clinical Impressions(s) / ED Diagnoses   Final diagnoses:  Mild persistent asthma with exacerbation    New Prescriptions New Prescriptions   AZITHROMYCIN (ZITHROMAX) 250 MG TABLET    Take 1 tablet (250 mg total) by mouth at bedtime. 1 tab  qhs x 4 days   PREDNISONE (DELTASONE) 10 MG TABLET    Take 6 tablets (60 mg total) by mouth daily.    I personally performed the services described in this documentation, which was scribed in my presence. The recorded information has been reviewed and is accurate.     Derwood Kaplan, MD 06/14/16 0151

## 2016-06-14 NOTE — Discharge Instructions (Signed)
We saw you in the ER for your asthma related complains. We gave you some breathing treatments in the ER, and seems like your symptoms have improved. Please take albuterol as needed every 4 hours. Please take the medications prescribed. Please refrain from smoking or smoke exposure. Please see a primary care doctor in 1 week. Return to the ER if your symptoms worsen.
